# Patient Record
Sex: Male | Born: 1965 | Race: White | Hispanic: No | Marital: Married | Smoking: Current some day smoker
Health system: Southern US, Community
[De-identification: ages and names within clinical notes are randomized; demographics above are authoritative.]

## PROBLEM LIST (undated history)

## (undated) DIAGNOSIS — K5909 Other constipation: Secondary | ICD-10-CM

## (undated) DIAGNOSIS — G629 Polyneuropathy, unspecified: Secondary | ICD-10-CM

## (undated) DIAGNOSIS — M25522 Pain in left elbow: Secondary | ICD-10-CM

## (undated) DIAGNOSIS — I712 Thoracic aortic aneurysm, without rupture, unspecified: Secondary | ICD-10-CM

## (undated) DIAGNOSIS — E7800 Pure hypercholesterolemia, unspecified: Secondary | ICD-10-CM

## (undated) DIAGNOSIS — R7989 Other specified abnormal findings of blood chemistry: Secondary | ICD-10-CM

## (undated) DIAGNOSIS — R55 Syncope and collapse: Secondary | ICD-10-CM

## (undated) DIAGNOSIS — Q2381 Bicuspid aortic valve: Secondary | ICD-10-CM

## (undated) DIAGNOSIS — G4730 Sleep apnea, unspecified: Secondary | ICD-10-CM

## (undated) DIAGNOSIS — M67929 Unspecified disorder of synovium and tendon, unspecified upper arm: Secondary | ICD-10-CM

## (undated) DIAGNOSIS — Z950 Presence of cardiac pacemaker: Secondary | ICD-10-CM

## (undated) DIAGNOSIS — I4891 Unspecified atrial fibrillation: Secondary | ICD-10-CM

## (undated) DIAGNOSIS — K760 Fatty (change of) liver, not elsewhere classified: Secondary | ICD-10-CM

## (undated) DIAGNOSIS — R42 Dizziness and giddiness: Secondary | ICD-10-CM

## (undated) DIAGNOSIS — E785 Hyperlipidemia, unspecified: Secondary | ICD-10-CM

## (undated) DIAGNOSIS — R0789 Other chest pain: Secondary | ICD-10-CM

## (undated) DIAGNOSIS — Q231 Congenital insufficiency of aortic valve: Secondary | ICD-10-CM

## (undated) DIAGNOSIS — I10 Essential (primary) hypertension: Secondary | ICD-10-CM

## (undated) HISTORY — DX: Congenital insufficiency of aortic valve: Q23.1

## (undated) HISTORY — DX: Presence of cardiac pacemaker: Z95.0

## (undated) HISTORY — DX: Other chest pain: R07.89

## (undated) HISTORY — PX: KNEE ARTHROSCOPY: SUR90

## (undated) HISTORY — DX: Hyperlipidemia, unspecified: E78.5

## (undated) HISTORY — DX: Unspecified atrial fibrillation: I48.91

## (undated) HISTORY — DX: Thoracic aortic aneurysm, without rupture: I71.2

## (undated) HISTORY — DX: Dizziness and giddiness: R42

## (undated) HISTORY — PX: PACEMAKER INSERTION: SHX728

## (undated) HISTORY — DX: Essential (primary) hypertension: I10

## (undated) HISTORY — DX: Other specified abnormal findings of blood chemistry: R79.89

## (undated) HISTORY — DX: Pain in left elbow: M25.522

## (undated) HISTORY — DX: Thoracic aortic aneurysm, without rupture, unspecified: I71.20

## (undated) HISTORY — DX: Fatty (change of) liver, not elsewhere classified: K76.0

## (undated) HISTORY — PX: COLONOSCOPY: SHX174

## (undated) HISTORY — DX: Polyneuropathy, unspecified: G62.9

## (undated) HISTORY — DX: Bicuspid aortic valve: Q23.81

## (undated) HISTORY — DX: Syncope and collapse: R55

## (undated) HISTORY — DX: Unspecified disorder of synovium and tendon, unspecified upper arm: M67.929

---

## 2014-05-17 ENCOUNTER — Encounter (HOSPITAL_COMMUNITY): Payer: Self-pay

## 2014-05-17 ENCOUNTER — Emergency Department (HOSPITAL_COMMUNITY): Payer: BLUE CROSS/BLUE SHIELD

## 2014-05-17 ENCOUNTER — Emergency Department (HOSPITAL_COMMUNITY)
Admission: EM | Admit: 2014-05-17 | Discharge: 2014-05-17 | Disposition: A | Discharge status: Home / Self Care | Payer: BLUE CROSS/BLUE SHIELD | Attending: Emergency Medicine | Admitting: Emergency Medicine

## 2014-05-17 DIAGNOSIS — R001 Bradycardia, unspecified: Secondary | ICD-10-CM | POA: Diagnosis not present

## 2014-05-17 DIAGNOSIS — R55 Syncope and collapse: Secondary | ICD-10-CM

## 2014-05-17 DIAGNOSIS — E780 Pure hypercholesterolemia: Secondary | ICD-10-CM | POA: Insufficient documentation

## 2014-05-17 DIAGNOSIS — Z79899 Other long term (current) drug therapy: Secondary | ICD-10-CM | POA: Insufficient documentation

## 2014-05-17 LAB — BASIC METABOLIC PANEL
Anion gap: 4 — ABNORMAL LOW (ref 5–15)
BUN: 10 mg/dL (ref 6–23)
CALCIUM: 8.4 mg/dL (ref 8.4–10.5)
CO2: 29 mmol/L (ref 19–32)
CREATININE: 0.97 mg/dL (ref 0.50–1.35)
Chloride: 108 mmol/L (ref 96–112)
GFR calc Af Amer: >90 mL/min (ref >90)
GFR calc non Af Amer: >90 mL/min (ref >90)
Glucose, Bld: 103 mg/dL — ABNORMAL HIGH (ref 70–99)
POTASSIUM: 4.1 mmol/L (ref 3.5–5.1)
Sodium: 141 mmol/L (ref 135–145)

## 2014-05-17 LAB — CBC WITH DIFFERENTIAL/PLATELET
BASOS ABS: 0 10*3/uL (ref 0.0–0.1)
Basophils Relative: 0 % (ref 0–1)
Eosinophils Absolute: 0.2 10*3/uL (ref 0.0–0.7)
Eosinophils Relative: 2 % (ref 0–5)
HCT: 40 % (ref 39.0–52.0)
Hemoglobin: 13.6 g/dL (ref 13.0–17.0)
LYMPHS ABS: 1.6 10*3/uL (ref 0.7–4.0)
Lymphocytes Relative: 18 % (ref 12–46)
MCH: 30 pg (ref 26.0–34.0)
MCHC: 34 g/dL (ref 30.0–36.0)
MCV: 88.3 fL (ref 78.0–100.0)
MONO ABS: 0.8 10*3/uL (ref 0.1–1.0)
Monocytes Relative: 9 % (ref 3–12)
NEUTROS PCT: 71 % (ref 43–77)
Neutro Abs: 6.3 10*3/uL (ref 1.7–7.7)
PLATELETS: 244 10*3/uL (ref 150–400)
RBC: 4.53 MIL/uL (ref 4.22–5.81)
RDW: 12.2 % (ref 11.5–15.5)
WBC: 8.9 10*3/uL (ref 4.0–10.5)

## 2014-05-17 LAB — I-STAT TROPONIN, ED: TROPONIN I, POC: 0 ng/mL (ref 0.00–0.08)

## 2014-05-17 MED ORDER — SODIUM CHLORIDE 0.9 % IV BOLUS (SEPSIS)
1000.0000 mL | Freq: Once | INTRAVENOUS | Status: AC
  Administered 2014-05-17: 1000 mL via INTRAVENOUS

## 2014-05-17 NOTE — ED Provider Notes (Signed)
CSN: [id number]     Arrival date & time 05/17/14  1308 History   First MD Initiated Contact with Patient 05/17/14 1309     Chief Complaint  Patient presents with  . Loss of Consciousness     (Consider location/radiation/quality/duration/timing/severity/associated sxs/prior Treatment) Patient is a 49 y.o. male presenting with near-syncope.  Near Syncope This is a new problem. The current episode started less than 1 hour ago. The problem occurs constantly. The problem has been resolved. Pertinent negatives include no chest pain, no abdominal pain, no headaches and no shortness of breath. Nothing aggravates the symptoms. Nothing relieves the symptoms. He has tried nothing for the symptoms.    Past Medical History  Diagnosis Date  . Hypercholesteremia    Past Surgical History  Procedure Laterality Date  . Knee arthroscopy     History reviewed. No pertinent family history. History  Substance Use Topics  . Smoking status: Never Smoker   . Smokeless tobacco: Not on file  . Alcohol Use: Yes     Comment: weekend use    Review of Systems  Respiratory: Negative for shortness of breath.   Cardiovascular: Positive for near-syncope. Negative for chest pain.  Gastrointestinal: Negative for abdominal pain.  Neurological: Negative for headaches.  All other systems reviewed and are negative.     Allergies  Lipitor and Simvastatin  Home Medications   Prior to Admission medications   Medication Sig Start Date End Date Taking? Authorizing Provider  Ascorbic Acid (VITAMIN C) 1000 MG tablet Take 1,000 mg by mouth daily.   Yes Historical Provider, MD  Multiple Vitamins-Minerals (AIRBORNE PO) Take 1 tablet by mouth daily.   Yes Historical Provider, MD  Multiple Vitamins-Minerals (MULTIVITAMIN WITH MINERALS) tablet Take 1 tablet by mouth daily.   Yes Historical Provider, MD  rosuvastatin (CRESTOR) 10 MG tablet Take 10 mg by mouth every other day.   Yes Historical Provider, MD   BP 107/72  mmHg  Pulse 52  Temp(Src) 97.9 F (36.6 C) (Oral)  Resp 15  Ht  (1.854 m)  Wt 195 lb (88.451 kg)  BMI 25.73 kg/m2  SpO2 100% Physical Exam  Constitutional: He is oriented to person, place, and time. He appears well-developed and well-nourished.  HENT:  Head: Normocephalic and atraumatic.  Eyes: Conjunctivae and EOM are normal.  Neck: Normal range of motion. Neck supple.  Cardiovascular: Normal rate, regular rhythm and normal heart sounds.   Pulmonary/Chest: Effort normal and breath sounds normal. No respiratory distress.  Abdominal: He exhibits no distension. There is no tenderness. There is no rebound and no guarding.  Musculoskeletal: Normal range of motion.  Neurological: He is alert and oriented to person, place, and time. He has normal strength and normal reflexes. No cranial nerve deficit or sensory deficit. Coordination normal. GCS eye subscore is 4. GCS verbal subscore is 5. GCS motor subscore is 6.  Skin: Skin is warm and dry.  Vitals reviewed.   ED Course  Procedures (including critical care time) Labs Review Labs Reviewed  BASIC METABOLIC PANEL - Abnormal; Notable for the following:    Glucose, Bld 103 (*)    Anion gap 4 (*)    All other components within normal limits  CBC WITH DIFFERENTIAL/PLATELET  I-STAT TROPOININ, ED    Imaging Review Ct Head Wo Contrast  05/18/2014   CLINICAL DATA:  Near syncopal episode today. Generalized weakness. No loss of consciousness. Symptoms lasted 20 min before resolving. Similar episode yesterday. Mild chest pain. Nausea yesterday.  EXAM: CT  HEAD WITHOUT CONTRAST  TECHNIQUE: Contiguous axial images were obtained from the base of the skull through the vertex without intravenous contrast.  COMPARISON:  None.  FINDINGS: Ventricles and sulci appear symmetrical. No ventricular dilatation. No mass effect or midline shift. No abnormal extra-axial fluid collections. Gray-white matter junctions are distinct. Basal cisterns are not effaced.  No evidence of acute intracranial hemorrhage. No depressed skull fractures. Mild mucosal thickening in the paranasal sinuses. Mastoid air cells are not opacified.  IMPRESSION: No acute intracranial abnormalities. Mild mucosal thickening in the paranasal sinuses.   Electronically Signed   By: Burman NievesWilliam  Stevens M.D.   On: 05/18/2014 17:57   Ct Angio Chest Aorta W/cm &/or Wo/cm  05/19/2014   CLINICAL DATA:  Two syncopal episodes, chest pain, no shortness of breath  EXAM: CT ANGIOGRAPHY CHEST WITH CONTRAST  TECHNIQUE: Multidetector CT imaging of the chest was performed using the standard protocol during bolus administration of intravenous contrast. Multiplanar CT image reconstructions and MIPs were obtained to evaluate the vascular anatomy.  CONTRAST:  100mL OMNIPAQUE IOHEXOL 350 MG/ML SOLN  COMPARISON:  None.  FINDINGS: There is an ascending thoracic aortic aneurysm measuring 4.8 cm in diameter. The remainder of the thoracic aorta is normal in caliber. There is no thoracic aortic dissection. There is no para-aortic hematoma. The heart size is normal. There is no pericardial effusion.  The lungs are clear. There is no focal consolidation, pleural effusion or pneumothorax.  There is no axillary, hilar, or mediastinal adenopathy.  There is no lytic or blastic osseous lesion.  There is a 12 mm hyperenhancing right hepatic lesion likely reflecting a flash filling hemangioma or arterioportal malformation. There is a similar lesion in the left hepatic lobe measuring 8 mm.  Review of the MIP images confirms the above findings.  IMPRESSION: 1. No thoracic aortic dissection. 2. Ascending thoracic aortic aneurysm. Ascending thoracic aortic aneurysm. Recommend semi-annual imaging followup by CTA or MRA and referral to cardiothoracic surgery if not already obtained. This recommendation follows 2010 ACCF/AHA/AATS/ACR/ASA/SCA/SCAI/SIR/STS/SVM Guidelines for the Diagnosis and Management of Patients With Thoracic Aortic Disease.  Circulation. 2010; 121: [id number] 3. There is a 12 mm hyperenhancing right hepatic lesion and a similar smaller lobe 8 mm left hepatic lesion likely reflecting a flash filling hemangioma or arterioportal malformation. If there is further clinical concern, further evaluation with a non emergent MRI of the abdomen can be performed.   Electronically Signed   By: Elige KoHetal  Patel   On: 05/19/2014 16:04     EKG Interpretation   Date/Time:  Sunday May 17 2014 13:18:31 EST Ventricular Rate:  48 PR Interval:  206 QRS Duration: 113 QT Interval:  436 QTC Calculation: 389 R Axis:   -12 Text Interpretation:  Sinus bradycardia Borderline intraventricular  conduction delay ST elev, probable normal early repol pattern No old  tracing to compare Confirmed by Mirian MoGentry, Aoi Kouns [phone]) on 05/17/2014  2:23:47 PM      MDM   Final diagnoses:  Near syncope    49 y.o. male with pertinent PMH of hld presents with near syncopal episode while at church.  Pt was kneeling prior to onset of symptoms, no chest pain or dyspnea. No family ho sudden death or cardiogenic syncope. No neuro complaint.  EMS found pt to have BP 70 systolic.  400 cc fluids given.  Also bradycardic.  Physical exam on arrival as above.    Symptoms relieved by NS bolus.  Discussed admission with pt, with shared decision making agreed to dc home with  cardiology fu (they have arranged for refferal to Dr. Gala Romney).  DC home in stable condition.    I have reviewed all laboratory and imaging studies if ordered as above  1. Near syncope         Noel Gerold, MD 05/20/14 [phone]

## 2014-05-17 NOTE — ED Notes (Signed)
[facility]- pt coming from church after becoming weak, dizzy, and diaphoretic. Hypotensive on EMS arrival at 70/palp, given 400cc of fluid and placed in trendelenburg with pressure up to 116 palpated. CBG of 92. Bradycardic in the 40-50range with history of being very active.

## 2014-05-17 NOTE — Discharge Instructions (Signed)
Cardiac Event Monitoring °A cardiac event monitor is a small recording device used to help detect abnormal heart rhythms (arrhythmias). The monitor is used to record heart rhythm when noticeable symptoms such as the following occur: °· Fast heartbeats (palpitations), such as heart racing or fluttering. °· Dizziness. °· Fainting or light-headedness. °· Unexplained weakness. °The monitor is wired to two electrodes placed on your chest. Electrodes are flat, sticky disks that attach to your skin. The monitor can be worn for up to 30 days. You will wear the monitor at all times, except when bathing.  °HOW TO USE YOUR CARDIAC EVENT MONITOR °A technician will prepare your chest for the electrode placement. The technician will show you how to place the electrodes, how to work the monitor, and how to replace the batteries. Take time to practice using the monitor before you leave the office. Make sure you understand how to send the information from the monitor to your health care provider. This requires a telephone with a landline, not a cell phone. You need to: °· Wear your monitor at all times, except when you are in water: °· Do not get the monitor wet. °· Take the monitor off when bathing. Do not swim or use a hot tub with it on. °· Keep your skin clean. Do not put body lotion or moisturizer on your chest. °· Change the electrodes daily or any time they stop sticking to your skin. You might need to use tape to keep them on. °· It is possible that your skin under the electrodes could become irritated. To keep this from happening, try to put the electrodes in slightly different places on your chest. However, they must remain in the area under your left breast and in the upper right section of your chest. °· Make sure the monitor is safely clipped to your clothing or in a location close to your body that your health care provider recommends. °· Press the button to record when you feel symptoms of heart trouble, such as  dizziness, weakness, light-headedness, palpitations, thumping, shortness of breath, unexplained weakness, or a fluttering or racing heart. The monitor is always on and records what happened slightly before you pressed the button, so do not worry about being too late to get good information. °· Keep a diary of your activities, such as walking, doing chores, and taking medicine. It is especially important to note what you were doing when you pushed the button to record your symptoms. This will help your health care provider determine what might be contributing to your symptoms. The information stored in your monitor will be reviewed by your health care provider alongside your diary entries. °· Send the recorded information as recommended by your health care provider. It is important to understand that it will take some time for your health care provider to process the results. °· Change the batteries as recommended by your health care provider. °SEEK IMMEDIATE MEDICAL CARE IF:  °· You have chest pain. °· You have extreme difficulty breathing or shortness of breath. °· You develop a very fast heartbeat that persists. °· You develop dizziness that does not go away. °· You faint or constantly feel you are about to faint. °Document Released: 12/07/2007 Document Revised: 07/14/2013 Document Reviewed: 08/26/2012 °ExitCare® Patient Information ©2015 [employer]. This information is not intended to replace advice given to you by your health care provider. Make sure you discuss any questions you have with your health care provider. ° °Near-Syncope °Near-syncope (commonly known as   near fainting) is sudden weakness, dizziness, or feeling like you might pass out. During an episode of near-syncope, you may also develop pale skin, have tunnel vision, or feel sick to your stomach (nauseous). Near-syncope may occur when getting up after sitting or while standing for a long time. It is caused by a sudden decrease in blood flow to  the brain. This decrease can result from various causes or triggers, most of which are not serious. However, because near-syncope can sometimes be a sign of something serious, a medical evaluation is required. The specific cause is often not determined. °HOME CARE INSTRUCTIONS  °Monitor your condition for any changes. The following actions may help to alleviate any discomfort you are experiencing: °· Have someone stay with you until you feel stable. °· Lie down right away and prop your feet up if you start feeling like you might faint. Breathe deeply and steadily. Wait until all the symptoms have passed. Most of these episodes last only a few minutes. You may feel tired for several hours.   °· Drink enough fluids to keep your urine clear or pale yellow.   °· If you are taking blood pressure or heart medicine, get up slowly when seated or lying down. Take several minutes to sit and then stand. This can reduce dizziness. °· Follow up with your health care provider as directed.  °SEEK IMMEDIATE MEDICAL CARE IF:  °· You have a severe headache.   °· You have unusual pain in the chest, abdomen, or back.   °· You are bleeding from the mouth or rectum, or you have black or tarry stool.   °· You have an irregular or very fast heartbeat.   °· You have repeated fainting or have seizure-like jerking during an episode.   °· You faint when sitting or lying down.   °· You have confusion.   °· You have difficulty walking.   °· You have severe weakness.   °· You have vision problems.   °MAKE SURE YOU:  °· Understand these instructions. °· Will watch your condition. °· Will get help right away if you are not doing well or get worse. °Document Released: 02/27/2005 Document Revised: 03/04/2013 Document Reviewed: 08/02/2012 °ExitCare® Patient Information ©2015 [employer]. This information is not intended to replace advice given to you by your health care provider. Make sure you discuss any questions you have with your health care  provider. ° °

## 2014-05-17 NOTE — ED Notes (Signed)
Patient transported to X-ray 

## 2014-05-18 ENCOUNTER — Encounter (HOSPITAL_COMMUNITY): Payer: Self-pay | Admitting: Emergency Medicine

## 2014-05-18 ENCOUNTER — Observation Stay (HOSPITAL_COMMUNITY)
Admission: EM | Admit: 2014-05-18 | Discharge: 2014-05-19 | Disposition: A | Discharge status: Home / Self Care | Payer: BLUE CROSS/BLUE SHIELD | Attending: Internal Medicine | Admitting: Internal Medicine

## 2014-05-18 ENCOUNTER — Emergency Department (HOSPITAL_COMMUNITY): Payer: BLUE CROSS/BLUE SHIELD

## 2014-05-18 ENCOUNTER — Observation Stay (HOSPITAL_COMMUNITY): Payer: BLUE CROSS/BLUE SHIELD

## 2014-05-18 DIAGNOSIS — R55 Syncope and collapse: Secondary | ICD-10-CM

## 2014-05-18 DIAGNOSIS — I77819 Aortic ectasia, unspecified site: Secondary | ICD-10-CM

## 2014-05-18 DIAGNOSIS — I712 Thoracic aortic aneurysm, without rupture: Secondary | ICD-10-CM | POA: Diagnosis present

## 2014-05-18 DIAGNOSIS — Z79899 Other long term (current) drug therapy: Secondary | ICD-10-CM | POA: Insufficient documentation

## 2014-05-18 DIAGNOSIS — E780 Pure hypercholesterolemia: Secondary | ICD-10-CM | POA: Diagnosis not present

## 2014-05-18 DIAGNOSIS — I7121 Aneurysm of the ascending aorta, without rupture: Secondary | ICD-10-CM | POA: Diagnosis present

## 2014-05-18 HISTORY — DX: Syncope and collapse: R55

## 2014-05-18 HISTORY — DX: Pure hypercholesterolemia, unspecified: E78.00

## 2014-05-18 LAB — I-STAT TROPONIN, ED: Troponin i, poc: 0 ng/mL (ref 0.00–0.08)

## 2014-05-18 LAB — BASIC METABOLIC PANEL
Anion gap: 5 (ref 5–15)
BUN: 8 mg/dL (ref 6–23)
CHLORIDE: 108 mmol/L (ref 96–112)
CO2: 29 mmol/L (ref 19–32)
CREATININE: 1.02 mg/dL (ref 0.50–1.35)
Calcium: 8.9 mg/dL (ref 8.4–10.5)
GFR calc Af Amer: >90 mL/min (ref >90)
GFR calc non Af Amer: 85 mL/min — ABNORMAL LOW (ref >90)
GLUCOSE: 92 mg/dL (ref 70–99)
POTASSIUM: 4.2 mmol/L (ref 3.5–5.1)
Sodium: 142 mmol/L (ref 135–145)

## 2014-05-18 LAB — CBC WITH DIFFERENTIAL/PLATELET
BASOS PCT: 0 % (ref 0–1)
Basophils Absolute: 0 10*3/uL (ref 0.0–0.1)
EOS ABS: 0.2 10*3/uL (ref 0.0–0.7)
Eosinophils Relative: 3 % (ref 0–5)
HCT: 42.5 % (ref 39.0–52.0)
HEMOGLOBIN: 14.2 g/dL (ref 13.0–17.0)
Lymphocytes Relative: 27 % (ref 12–46)
Lymphs Abs: 1.9 10*3/uL (ref 0.7–4.0)
MCH: 29.8 pg (ref 26.0–34.0)
MCHC: 33.4 g/dL (ref 30.0–36.0)
MCV: 89.1 fL (ref 78.0–100.0)
MONOS PCT: 8 % (ref 3–12)
Monocytes Absolute: 0.5 10*3/uL (ref 0.1–1.0)
NEUTROS PCT: 62 % (ref 43–77)
Neutro Abs: 4.4 10*3/uL (ref 1.7–7.7)
PLATELETS: 276 10*3/uL (ref 150–400)
RBC: 4.77 MIL/uL (ref 4.22–5.81)
RDW: 12.1 % (ref 11.5–15.5)
WBC: 7 10*3/uL (ref 4.0–10.5)

## 2014-05-18 LAB — HEPATIC FUNCTION PANEL
ALK PHOS: 62 U/L (ref 39–117)
ALT: 36 U/L (ref 0–53)
AST: 22 U/L (ref 0–37)
Albumin: 4.1 g/dL (ref 3.5–5.2)
Bilirubin, Direct: 0.1 mg/dL (ref 0.0–0.5)
Indirect Bilirubin: 0.6 mg/dL (ref 0.3–0.9)
Total Bilirubin: 0.7 mg/dL (ref 0.3–1.2)
Total Protein: 7.2 g/dL (ref 6.0–8.3)

## 2014-05-18 LAB — MAGNESIUM: Magnesium: 2.2 mg/dL (ref 1.5–2.5)

## 2014-05-18 LAB — TROPONIN I: Troponin I: <0.03 ng/mL (ref <0.031)

## 2014-05-18 LAB — PHOSPHORUS: Phosphorus: 3.2 mg/dL (ref 2.3–4.6)

## 2014-05-18 LAB — TSH: TSH: 1.345 u[IU]/mL (ref 0.350–4.500)

## 2014-05-18 MED ORDER — SODIUM CHLORIDE 0.9 % IV SOLN: 250.0000 mL | INTRAVENOUS | Status: DC | PRN

## 2014-05-18 MED ORDER — HEPARIN SODIUM (PORCINE) 5000 UNIT/ML IJ SOLN
5000.0000 [IU] | Freq: Three times a day (TID) | INTRAMUSCULAR | Status: DC
  Administered 2014-05-18: 5000 [IU] via SUBCUTANEOUS
  Filled 2014-05-18 (×2): qty 1

## 2014-05-18 MED ORDER — SODIUM CHLORIDE 0.9 % IJ SOLN: 3.0000 mL | Freq: Two times a day (BID) | INTRAMUSCULAR | Status: DC

## 2014-05-18 MED ORDER — ACETAMINOPHEN 325 MG PO TABS
650.0000 mg | ORAL_TABLET | Freq: Four times a day (QID) | ORAL | Status: DC | PRN
  Administered 2014-05-18: 650 mg via ORAL
  Filled 2014-05-18: qty 2

## 2014-05-18 MED ORDER — ACETAMINOPHEN 650 MG RE SUPP: 650.0000 mg | Freq: Four times a day (QID) | RECTAL | Status: DC | PRN

## 2014-05-18 MED ORDER — SODIUM CHLORIDE 0.9 % IJ SOLN
3.0000 mL | Freq: Two times a day (BID) | INTRAMUSCULAR | Status: DC
  Administered 2014-05-18 – 2014-05-19 (×2): 3 mL via INTRAVENOUS

## 2014-05-18 MED ORDER — ROSUVASTATIN CALCIUM 10 MG PO TABS: 10.0000 mg | ORAL_TABLET | ORAL | Status: DC

## 2014-05-18 MED ORDER — SODIUM CHLORIDE 0.9 % IJ SOLN: 3.0000 mL | INTRAMUSCULAR | Status: DC | PRN

## 2014-05-18 NOTE — H&P (Signed)
[facility] History and Physical  Jason Osborn ZOX:[id number] DOB: 15-Nov-1965 DOA: 05/18/2014  Referring physician: Dr. Preston Fleeting PCP: Vivien Presto, MD   Chief Complaint: Syncope   HPI: Jason Osborn is a 49 y.o. male  Reports patient being previously healthy. Presents to the hospital after 2 syncopal episodes. Patient evaluated yesterday 05/17/2014 at that point he had presented after syncopal episode while at church. He states he knelt down and passed out. Today he had near syncope and as such presented for further evaluation recommendations. He states that he has had an upper respiratory infection for the last month. He denies no decreased oral intake although he does state that he is more thirsty than usual within the last couple days. Denies any palpitations prior to passing out. Spouse present when patient passed out yesterday and reports no epileptic activity. The patient was out for minutes if not seconds when he came back to. Patient also reports dull chest discomfort that he reports felt like reflux.  Given syncope and near syncopal episodes were consulted for further evaluation recommendations   Review of Systems:  Constitutional:  No weight loss, night sweats, Fevers, chills, fatigue.  HEENT:  No headaches, Difficulty swallowing,Tooth/dental problems,Sore throat,  No sneezing, itching, ear ache, nasal congestion, post nasal drip,  Cardio-vascular:  + chest pain, Orthopnea, PND, swelling in lower extremities, anasarca, dizziness, palpitations  GI:  + heartburn, indigestion, abdominal pain, nausea, vomiting, diarrhea, change in bowel habits, loss of appetite  Resp:  No shortness of breath with exertion or at rest. No excess mucus, no productive cough, No non-productive cough, No coughing up of blood.No change in color of mucus.No wheezing.No chest wall deformity  Skin:  no rash or lesions.  GU:  no dysuria, change in color of urine, no urgency or frequency. No flank pain.    Musculoskeletal:  No joint pain or swelling. No decreased range of motion. No back pain.  Psych:  No change in mood or affect. No depression or anxiety. No memory loss.   Past Medical History  Diagnosis Date  . Hypercholesteremia    History reviewed. No pertinent past surgical history. Social History:  reports that he has never smoked. He does not have any smokeless tobacco history on file. He reports that he drinks alcohol. He reports that he does not use illicit drugs.  Allergies  Allergen Reactions  . Lipitor [Atorvastatin]     Frequent urination  . Simvastatin     Body aches     Family history - None reported when asked directly  Prior to Admission medications   Medication Sig Start Date End Date Taking? Authorizing Provider  Ascorbic Acid (VITAMIN C) 1000 MG tablet Take 1,000 mg by mouth daily.   Yes Historical Provider, MD  Multiple Vitamins-Minerals (AIRBORNE PO) Take 1 tablet by mouth daily.   Yes Historical Provider, MD  Multiple Vitamins-Minerals (MULTIVITAMIN WITH MINERALS) tablet Take 1 tablet by mouth daily.   Yes Historical Provider, MD  rosuvastatin (CRESTOR) 10 MG tablet Take 10 mg by mouth every other day.   Yes Historical Provider, MD   Physical Exam: Filed Vitals:   05/18/14 1630 05/18/14 1645 05/18/14 1700 05/18/14 1715  BP: 109/70 112/69 111/74 112/78  Pulse: 61 62 62 69  Temp:      TempSrc:      Resp: SpO2: 97% 98% 98% 98%    Wt Readings from Last 3 Encounters:  05/17/14 88.451 kg (195 lb)    General:  Appears  calm and comfortable Eyes: PERRL, normal lids, irises & conjunctiva ENT: grossly normal hearing, lips & tongue Neck: no LAD, masses or thyromegaly Cardiovascular: RRR, no m/r/g. No LE edema. Respiratory: CTA bilaterally, no w/r/r. Normal respiratory effort. Abdomen: soft, nt, nd Skin: no rash or induration seen on limited exam Musculoskeletal: grossly normal tone BUE/BLE Psychiatric: grossly normal mood and affect,  speech fluent and appropriate Neurologic: grossly non-focal.          Labs on Admission:  Basic Metabolic Panel:  Recent Labs Lab 05/17/14 1331 05/18/14 1448  NA 141 142  K 4.1 4.2  CL 108 108  CO2 29 29  GLUCOSE 103* 92  BUN 10 8  CREATININE 0.97 1.02  CALCIUM 8.4 8.9   Liver Function Tests: No results for input(s): AST, ALT, ALKPHOS, BILITOT, PROT, ALBUMIN in the last 168 hours. No results for input(s): LIPASE, AMYLASE in the last 168 hours. No results for input(s): AMMONIA in the last 168 hours. CBC:  Recent Labs Lab 05/17/14 1331 05/18/14 1448  WBC 8.9 7.0  NEUTROABS 6.3 4.4  HGB 13.6 14.2  HCT 40.0 42.5  MCV 88.3 89.1  PLT 244 276   Cardiac Enzymes: No results for input(s): CKTOTAL, CKMB, CKMBINDEX, TROPONINI in the last 168 hours.  BNP (last 3 results) No results for input(s): BNP in the last 8760 hours.  ProBNP (last 3 results) No results for input(s): PROBNP in the last 8760 hours.  CBG: No results for input(s): GLUCAP in the last 168 hours.  Radiological Exams on Admission: Dg Chest 2 View  05/18/2014   CLINICAL DATA:  Dizziness and lightheadedness. Syncope. Chest tightness.  EXAM: CHEST  2 VIEW  COMPARISON:  05/17/2014  FINDINGS: The heart size and mediastinal contours are within normal limits. Both lungs are clear. The visualized skeletal structures are unremarkable.  IMPRESSION: No active cardiopulmonary disease.   Electronically Signed   By: Gaylyn RongWalter  Liebkemann M.D.   On: 05/18/2014 15:43   Dg Chest 2 View  05/17/2014   CLINICAL DATA:  Loss of consciousness.  EXAM: CHEST  2 VIEW  COMPARISON:  None.  FINDINGS: The heart size and mediastinal contours are within normal limits. Both lungs are clear. The visualized skeletal structures are unremarkable.  IMPRESSION: No active cardiopulmonary disease.   Electronically Signed   By: Myles RosenthalJohn  Stahl M.D.   On: 05/17/2014 16:10   Ct Head Wo Contrast  05/18/2014   CLINICAL DATA:  Near syncopal episode today.  Generalized weakness. No loss of consciousness. Symptoms lasted 20 min before resolving. Similar episode yesterday. Mild chest pain. Nausea yesterday.  EXAM: CT HEAD WITHOUT CONTRAST  TECHNIQUE: Contiguous axial images were obtained from the base of the skull through the vertex without intravenous contrast.  COMPARISON:  None.  FINDINGS: Ventricles and sulci appear symmetrical. No ventricular dilatation. No mass effect or midline shift. No abnormal extra-axial fluid collections. Gray-white matter junctions are distinct. Basal cisterns are not effaced. No evidence of acute intracranial hemorrhage. No depressed skull fractures. Mild mucosal thickening in the paranasal sinuses. Mastoid air cells are not opacified.  IMPRESSION: No acute intracranial abnormalities. Mild mucosal thickening in the paranasal sinuses.   Electronically Signed   By: Burman NievesWilliam  Stevens M.D.   On: 05/18/2014 17:57    EKG: Independently reviewed. Sinus bradycardia with no ST elevations or depressions  Assessment/Plan Active Problems:   Near syncope/Syncope - We'll obtain routine workup. Obtain echocardiogram, carotid duplex, telemetry monitoring, EKG next a.m., troponins every 6 hours 3, and orthostatic vital signs. -  We'll not obtain d-dimer as patient denies any tachycardia, shortness of breath, hemoptysis, or any prolonged trips - We'll not obtain EEG as there was no reports of epileptic activity - Patient has had bradycardia but has not had hypotension. We'll monitor on telemetry, should patient have any altered mental status and/or decrease in blood pressure associated with bradycardia will consult cardiology for further evaluation recommendations  Code Status: full DVT Prophylaxis: heparin Family Communication: discussed with patient and spouse at bedside.  Disposition Plan: Pending work up  Time spent: > 45 minutes  Penny Pia [facility] Pager [phone]

## 2014-05-18 NOTE — ED Provider Notes (Signed)
CSN: [id number]     Arrival date & time 05/18/14  1433 History   First MD Initiated Contact with Patient 05/18/14 1659     Chief Complaint  Patient presents with  . Near Syncope     (Consider location/radiation/quality/duration/timing/severity/associated sxs/prior Treatment) Patient is a 49 y.o. male presenting with near-syncope. The history is provided by the patient and the spouse.  Near Syncope  He had a near syncopal episode today. He was in the bathroom when he started feeling lightheaded then he laid down on the bed. He was sweaty and wife states that he was pale. He is feeling generally weak. There was no actual loss of consciousness. Symptoms lasted about 20 minutes before resolving. He had a similar episode yesterday which lasted about 10 minutes. The episode yesterday occurred while kneeling and urge. He had been seen in the ED and was offered admission but decided to go home. He was unable to get a follow-up appointment with cardiology. He describes a very mild, dull chest pain which he rates at 1/10. He had nausea yesterday, but not today. There is no dyspnea. He has a history of hyperlipidemia for which he takes rosuvastatin, but is otherwise healthy.  Past Medical History  Diagnosis Date  . Hypercholesteremia    History reviewed. No pertinent past surgical history. History reviewed. No pertinent family history. History  Substance Use Topics  . Smoking status: Never Smoker   . Smokeless tobacco: Not on file  . Alcohol Use: Yes     Comment: weekend use    Review of Systems  Cardiovascular: Positive for near-syncope.  All other systems reviewed and are negative.     Allergies  Lipitor and Simvastatin  Home Medications   Prior to Admission medications   Medication Sig Start Date End Date Taking? Authorizing Provider  Ascorbic Acid (VITAMIN C) 1000 MG tablet Take 1,000 mg by mouth daily.   Yes Historical Provider, MD  Multiple Vitamins-Minerals (AIRBORNE PO) Take 1  tablet by mouth daily.   Yes Historical Provider, MD  Multiple Vitamins-Minerals (MULTIVITAMIN WITH MINERALS) tablet Take 1 tablet by mouth daily.   Yes Historical Provider, MD  rosuvastatin (CRESTOR) 10 MG tablet Take 10 mg by mouth every other day.   Yes Historical Provider, MD   BP 114/69 mmHg  Pulse 57  Temp(Src) 98.1 F (36.7 C) (Oral)  Resp 18  SpO2 100% Physical Exam  Nursing note and vitals reviewed.  49 year old male, resting comfortably and in no acute distress. Vital signs are significant for mild bradycardia. Oxygen saturation is 100%, which is normal. Head is normocephalic and atraumatic. PERRLA, EOMI. Oropharynx is clear. Neck is nontender and supple without adenopathy or JVD. There are no carotid bruits. Back is nontender and there is no CVA tenderness. Lungs are clear without rales, wheezes, or rhonchi. Chest is nontender. Heart has regular rate and rhythm without murmur. Abdomen is soft, flat, nontender without masses or hepatosplenomegaly and peristalsis is normoactive. Extremities have no cyanosis or edema, full range of motion is present. Skin is warm and dry without rash. Neurologic: Mental status is normal, cranial nerves are intact, there are no motor or sensory deficits.  ED Course  Procedures (including critical care time) Labs Review Results for orders placed or performed during the hospital encounter of 05/18/14  Basic metabolic panel  Result Value Ref Range   Sodium 142 135 - 145 mmol/L   Potassium 4.2 3.5 - 5.1 mmol/L   Chloride 108 96 - 112 mmol/L  CO2 29 19 - 32 mmol/L   Glucose, Bld 92 70 - 99 mg/dL   BUN 8 6 - 23 mg/dL   Creatinine, Ser 8.29 0.50 - 1.35 mg/dL   Calcium 8.9 8.4 - 56.2 mg/dL   GFR calc non Af Amer 85 (L) >90 mL/min   GFR calc Af Amer >90 >90 mL/min   Anion gap 5 5 - 15  CBC with Differential  Result Value Ref Range   WBC 7.0 4.0 - 10.5 K/uL   RBC 4.77 4.22 - 5.81 MIL/uL   Hemoglobin 14.2 13.0 - 17.0 g/dL   HCT 13.0 86.5 -  78.4 %   MCV 89.1 78.0 - 100.0 fL   MCH 29.8 26.0 - 34.0 pg   MCHC 33.4 30.0 - 36.0 g/dL   RDW 69.6 29.5 - 28.4 %   Platelets 276 150 - 400 K/uL   Neutrophils Relative % 62 43 - 77 %   Neutro Abs 4.4 1.7 - 7.7 K/uL   Lymphocytes Relative 27 12 - 46 %   Lymphs Abs 1.9 0.7 - 4.0 K/uL   Monocytes Relative 8 3 - 12 %   Monocytes Absolute 0.5 0.1 - 1.0 K/uL   Eosinophils Relative 3 0 - 5 %   Eosinophils Absolute 0.2 0.0 - 0.7 K/uL   Basophils Relative 0 0 - 1 %   Basophils Absolute 0.0 0.0 - 0.1 K/uL  I-stat troponin, ED (not at [facility])  Result Value Ref Range   Troponin i, poc 0.00 0.00 - 0.08 ng/mL   Comment 3           Imaging Review Dg Chest 2 View  05/18/2014   CLINICAL DATA:  Dizziness and lightheadedness. Syncope. Chest tightness.  EXAM: CHEST  2 VIEW  COMPARISON:  05/17/2014  FINDINGS: The heart size and mediastinal contours are within normal limits. Both lungs are clear. The visualized skeletal structures are unremarkable.  IMPRESSION: No active cardiopulmonary disease.   Electronically Signed   By: Gaylyn Rong M.D.   On: 05/18/2014 15:43   Dg Chest 2 View  05/17/2014   CLINICAL DATA:  Loss of consciousness.  EXAM: CHEST  2 VIEW  COMPARISON:  None.  FINDINGS: The heart size and mediastinal contours are within normal limits. Both lungs are clear. The visualized skeletal structures are unremarkable.  IMPRESSION: No active cardiopulmonary disease.   Electronically Signed   By: Myles Rosenthal M.D.   On: 05/17/2014 16:10     Date: 05/18/2014  Rate: 68  Rhythm: normal sinus rhythm  QRS Axis: normal  Intervals: normal  ST/T Wave abnormalities: normal  Conduction Disutrbances:none  Narrative Interpretation: Possible old anteroseptal myocardial infarction. When compared with ECG of 05/17/2014, no significant changes are seen.  Old EKG Reviewed: unchanged    MDM   Final diagnoses:  Near syncope    Near syncopal episode which has happened twice in 2 days. Old records were  reviewed confirming ED visit yesterday with negative workup. Given short times and between episodes, decision is made to admit him. He did not have liver enzymes done yesterday so hepatic function panel will be checked today. He will also have head CT done today. He may be a candidate for outpatient cardiac monitoring with an event monitor. Case is discussed with Dr. Cena Benton of [facility] who agrees to admit the patient under observation status.    Dione Booze, MD 05/18/14 [phone]

## 2014-05-18 NOTE — ED Notes (Signed)
Pt to CT at this time.

## 2014-05-18 NOTE — ED Notes (Signed)
Attempted report 

## 2014-05-18 NOTE — ED Notes (Signed)
Pt sts seen here for syncope yesterday; pt sts discharged and now feeling lightheaded today; pt denies pain or SOB

## 2014-05-19 ENCOUNTER — Observation Stay (HOSPITAL_COMMUNITY): Payer: BLUE CROSS/BLUE SHIELD

## 2014-05-19 ENCOUNTER — Encounter (HOSPITAL_COMMUNITY): Payer: Self-pay | Admitting: Physician Assistant

## 2014-05-19 DIAGNOSIS — E780 Pure hypercholesterolemia: Secondary | ICD-10-CM | POA: Diagnosis not present

## 2014-05-19 DIAGNOSIS — R55 Syncope and collapse: Secondary | ICD-10-CM

## 2014-05-19 DIAGNOSIS — I712 Thoracic aortic aneurysm, without rupture, unspecified: Secondary | ICD-10-CM | POA: Insufficient documentation

## 2014-05-19 DIAGNOSIS — I7121 Aneurysm of the ascending aorta, without rupture: Secondary | ICD-10-CM | POA: Diagnosis present

## 2014-05-19 DIAGNOSIS — Z79899 Other long term (current) drug therapy: Secondary | ICD-10-CM | POA: Diagnosis not present

## 2014-05-19 HISTORY — DX: Aneurysm of the ascending aorta, without rupture: I71.21

## 2014-05-19 HISTORY — DX: Thoracic aortic aneurysm, without rupture: I71.2

## 2014-05-19 LAB — BASIC METABOLIC PANEL
Anion gap: 4 — ABNORMAL LOW (ref 5–15)
BUN: 8 mg/dL (ref 6–23)
CO2: 30 mmol/L (ref 19–32)
CREATININE: 0.98 mg/dL (ref 0.50–1.35)
Calcium: 9 mg/dL (ref 8.4–10.5)
Chloride: 108 mmol/L (ref 96–112)
GFR calc Af Amer: >90 mL/min (ref >90)
Glucose, Bld: 99 mg/dL (ref 70–99)
Potassium: 3.5 mmol/L (ref 3.5–5.1)
Sodium: 142 mmol/L (ref 135–145)

## 2014-05-19 LAB — TROPONIN I
Troponin I: <0.03 ng/mL (ref <0.031)
Troponin I: <0.03 ng/mL (ref <0.031)

## 2014-05-19 LAB — CBC
HEMATOCRIT: 38.8 % — AB (ref 39.0–52.0)
Hemoglobin: 13 g/dL (ref 13.0–17.0)
MCH: 29.8 pg (ref 26.0–34.0)
MCHC: 33.5 g/dL (ref 30.0–36.0)
MCV: 89 fL (ref 78.0–100.0)
Platelets: 250 10*3/uL (ref 150–400)
RBC: 4.36 MIL/uL (ref 4.22–5.81)
RDW: 12.2 % (ref 11.5–15.5)
WBC: 6.5 10*3/uL (ref 4.0–10.5)

## 2014-05-19 MED ORDER — IOHEXOL 350 MG/ML SOLN
100.0000 mL | Freq: Once | INTRAVENOUS | Status: AC | PRN
  Administered 2014-05-19: 100 mL via INTRAVENOUS

## 2014-05-19 NOTE — Consult Note (Signed)
CARDIOLOGY CONSULT NOTE   Patient ID: Jason Osborn MRN: [id number] DOB/AGE: 1966-01-13 49 y.o.  Admit date: 05/18/2014  Primary Physician   Vivien Presto, MD Primary Cardiologist   New Reason for Consultation   Syncope  NWG:NFAOZ Jason Osborn is a 49 y.o. year old male with a history of HLD, but not HTN, CAD or arrhythmia.   He was in his usual state of health Sunday at church. He had eaten a banana for breakfast and had some water. He was kneeling, and had sudden onset of weakness. He felt hot and became diaphoretic. He felt like he was going to pass out and sat back. His wife realized something was wrong and spoke to him. He fell over into her lap. He was unresponsive for just a few seconds and then woke. There were no palpitations, no chest pain and no shortness of breath. He had immediate awareness of who he was and of those around him. He did not feel bad in any way. This had never happened before. EMS personnel were at the service, and responded. They were initially unable to obtain a pulse and a blood pressure, but part of this was due to technical difficulties. Initially, he had orthostatic dizziness with position changes. By EMS arrival, he was feeling much better and able to ambulate. He was seen in the emergency room, given IV fluids and discharged home. His wife notes that his heart rate was in the 40s when he was in the emergency room, sinus rhythm upon review.  Yesterday, he was getting ready to go to work and had just taken a shower. It was about 11 AM, and he had had a sandwich and a banana. He had a similar episode with weakness, diaphoresis and a hot feeling, but did not completely lose consciousness. He was able to lie down and gradually felt better. He came to the hospital and was admitted. He has not had any additional episodes of weakness or presyncope, but he has a distinct feeling of fatigue that is very different from usual for him. Orthostatics were mildly positive on  admission with a systolic blood pressure went from 108 lying to 98 standing. His heart rate increased from 61 up to 70.    Past Medical History  Diagnosis Date  . Hypercholesteremia      Past Surgical History  Procedure Laterality Date  . Knee arthroscopy      Allergies  Allergen Reactions  . Lipitor [Atorvastatin]     Frequent urination  . Simvastatin     Body aches    I have reviewed the patient's current medications . heparin  5,000 Units Subcutaneous 3 times per day  . rosuvastatin  10 mg Oral QODAY  . sodium chloride  3 mL Intravenous Q12H  . sodium chloride  3 mL Intravenous Q12H     sodium chloride, acetaminophen **OR** acetaminophen, sodium chloride  Prior to Admission medications   Medication Sig Start Date End Date Taking? Authorizing Provider  Ascorbic Acid (VITAMIN C) 1000 MG tablet Take 1,000 mg by mouth daily.   Yes Historical Provider, MD  Multiple Vitamins-Minerals (AIRBORNE PO) Take 1 tablet by mouth daily.   Yes Historical Provider, MD  Multiple Vitamins-Minerals (MULTIVITAMIN WITH MINERALS) tablet Take 1 tablet by mouth daily.   Yes Historical Provider, MD  rosuvastatin (CRESTOR) 10 MG tablet Take 10 mg by mouth every other day.   Yes Historical Provider, MD     History   Social History  .  Marital Status: Married    Spouse Name: N/A  . Number of Children: N/A  . Years of Education: N/A   Occupational History  . Administrator, arts    Social History Main Topics  . Smoking status: Never Smoker   . Smokeless tobacco: Not on file  . Alcohol Use: Yes     Comment: weekend use  . Drug Use: No  . Sexual Activity: Not on file   Other Topics Concern  . Not on file   Social History Narrative   Lives with wife, does yard work on the weekends.    Family Status  Relation Status Death Age  . Mother Alive     No hx CAD or arrhythmia  . Father Alive     Medical history unknown   History reviewed. No pertinent family history.   ROS:  Full 14  point review of systems complete and found to be negative unless listed above.  Physical Exam: Blood pressure 117/75, pulse 62, temperature 98.4 F (36.9 C), temperature source Oral, resp. rate 18, height  (1.854 m), weight 192 lb (87.091 kg), SpO2 98 %.  General: Well developed, well nourished, male in no acute distress Head: Eyes PERRLA, No xanthomas.   Normocephalic and atraumatic, oropharynx without edema or exudate. Dentition: good Lungs: clear bilaterally Heart: HRRR S1 S2, no rub/gallop, Heart irregular rate and rhythm with S1, S2  murmur. pulses are 2+ extrem.   Neck: No carotid bruits. No lymphadenopathy.  JVD. Abdomen: Bowel sounds present, abdomen soft and non-tender without masses or hernias noted. Msk:  No spine or cva tenderness. No weakness, no joint deformities or effusions. Extremities: No clubbing or cyanosis.  edema.  Neuro: Alert and oriented X 3. No focal deficits noted. Psych:  Good affect, responds appropriately Skin: No rashes or lesions noted.  Labs:   Lab Results  Component Value Date   WBC 6.5 05/19/2014   HGB 13.0 05/19/2014   HCT 38.8* 05/19/2014   MCV 89.0 05/19/2014   PLT 250 05/19/2014    Recent Labs Lab 05/18/14 1448 05/19/14 0131  NA 142 142  K 4.2 3.5  CL 108 108  CO2 29 30  BUN 8 8  CREATININE 1.02 0.98  CALCIUM 8.9 9.0  PROT 7.2  --   BILITOT 0.7  --   ALKPHOS 62  --   ALT 36  --   AST 22  --   GLUCOSE 92 99  ALBUMIN 4.1  --    MAGNESIUM  Date Value Ref Range Status  05/18/2014 2.2 1.5 - 2.5 mg/dL Final    Recent Labs  16/10/96 2037 05/19/14 0131 05/19/14 0723  TROPONINI <0.03 <0.03 <0.03    Recent Labs  05/17/14 1340 05/18/14 1503  TROPIPOC 0.00 0.00   TSH  Date/Time Value Ref Range Status  05/18/2014 08:37 PM 1.345 0.350 - 4.500 uIU/mL Final   Echo: 05/19/2014 Conclusions - Left ventricle: The cavity size was normal. Systolic function was normal. The estimated ejection fraction was in the range of  55% to 60%. Wall motion was normal; there were no regional wall motion abnormalities. - Aortic valve: There was mild regurgitation directed eccentrically in the LVOT and towards the mitral anterior leaflet. - Aorta: The aorta was moderately to severely dilated. Ascending aortic diameter: 49 mm (S).  ECG:  05/19/2014 Sinus rhythm, first-degree AV block  Radiology:  Dg Chest 2 View 05/18/2014   CLINICAL DATA:  Dizziness and lightheadedness. Syncope. Chest tightness.  EXAM: CHEST  2 VIEW  COMPARISON:  05/17/2014  FINDINGS: The heart size and mediastinal contours are within normal limits. Both lungs are clear. The visualized skeletal structures are unremarkable.  IMPRESSION: No active cardiopulmonary disease.   Electronically Signed   By: Gaylyn Rong M.D.   On: 05/18/2014 15:43   Ct Head Wo Contrast 05/18/2014   CLINICAL DATA:  Near syncopal episode today. Generalized weakness. No loss of consciousness. Symptoms lasted 20 min before resolving. Similar episode yesterday. Mild chest pain. Nausea yesterday.  EXAM: CT HEAD WITHOUT CONTRAST  TECHNIQUE: Contiguous axial images were obtained from the base of the skull through the vertex without intravenous contrast.  COMPARISON:  None.  FINDINGS: Ventricles and sulci appear symmetrical. No ventricular dilatation. No mass effect or midline shift. No abnormal extra-axial fluid collections. Gray-white matter junctions are distinct. Basal cisterns are not effaced. No evidence of acute intracranial hemorrhage. No depressed skull fractures. Mild mucosal thickening in the paranasal sinuses. Mastoid air cells are not opacified.  IMPRESSION: No acute intracranial abnormalities. Mild mucosal thickening in the paranasal sinuses.   Electronically Signed   By: Burman Nieves M.D.   On: 05/18/2014 17:57   Ct Angio Chest Aorta W/cm &/or Wo/cm 05/19/2014   CLINICAL DATA:  Two syncopal episodes, chest pain, no shortness of breath  EXAM: CT ANGIOGRAPHY CHEST WITH  CONTRAST  TECHNIQUE: Multidetector CT imaging of the chest was performed using the standard protocol during bolus administration of intravenous contrast. Multiplanar CT image reconstructions and MIPs were obtained to evaluate the vascular anatomy.  CONTRAST:  OMNIPAQUE IOHEXOL 350 MG/ML SOLN  COMPARISON:  None.  FINDINGS: There is an ascending thoracic aortic aneurysm measuring 4.8 cm in diameter. The remainder of the thoracic aorta is normal in caliber. There is no thoracic aortic dissection. There is no para-aortic hematoma. The heart size is normal. There is no pericardial effusion.  The lungs are clear. There is no focal consolidation, pleural effusion or pneumothorax.  There is no axillary, hilar, or mediastinal adenopathy.  There is no lytic or blastic osseous lesion.  There is a 12 mm hyperenhancing right hepatic lesion likely reflecting a flash filling hemangioma or arterioportal malformation. There is a similar lesion in the left hepatic lobe measuring 8 mm.  Review of the MIP images confirms the above findings.  IMPRESSION: 1. No thoracic aortic dissection. 2. Ascending thoracic aortic aneurysm. Ascending thoracic aortic aneurysm. Recommend semi-annual imaging followup by CTA or MRA and referral to cardiothoracic surgery if not already obtained. This recommendation follows 2010 ACCF/AHA/AATS/ACR/ASA/SCA/SCAI/SIR/STS/SVM Guidelines for the Diagnosis and Management of Patients With Thoracic Aortic Disease. Circulation. 2010; 121: A213-Y865 3. There is a 12 mm hyperenhancing right hepatic lesion and a similar smaller lobe 8 mm left hepatic lesion likely reflecting a flash filling hemangioma or arterioportal malformation. If there is further clinical concern, further evaluation with a non emergent MRI of the abdomen can be performed.   Electronically Signed   By: Elige Ko   On: 05/19/2014 16:04    ASSESSMENT AND PLAN:   The patient was seen today by Dr. Anne Fu, the patient evaluated and the data  reviewed.  Active Problems:   Near syncope  - see below    Syncope - Sinus bradycardia on telemetry, but heart rate is at least in the 40s and not low enough to cause symptoms - Patient has a history of presyncope and response to an emotionally stressful events but no history of syncope - Patient was mildly dehydrated Sunday and orthostatics were positive yesterday as  well. - Would encourage adequate hydration and liberalize salt intake - Consider compression stockings as well - Echocardiogram was normal - No further inpatient workup is indicated, okay for DC from a cardiac standpoint   Ascending thoracic aortic aneurysm - We will refer to TCTS, but this can be done as an outpatient - He will need to be followed for this but no further inpatient workup is needed  Signed: Theodore DemarkRhonda Barrett, PA-C 05/19/2014 4:36 PM Beeper [phone]  Personally seen and examined. Agree with above. Vasovagal syncope  - hydrate  - salt liberalization  - compression stockings  - if prodrome begins - lay down.   EF normal. Has had predisposition for several years (Wife has known him for 30 years and says that these symptoms/dx are all making sense).  OK for DC. Will follow up with me in future Will set up with [facility] to follow aorta. At 55mm may consider replacement.   - Mild aortic regurg. No mention of bicuspid which sometimes goes hand in hand in dilated aorta.   Donato SchultzSKAINS, Emmamarie Kluender, MD    Donato SchultzSKAINS, Heidemarie Goodnow, MD

## 2014-05-19 NOTE — Progress Notes (Signed)
[facility] PROGRESS NOTE   Jason DuncansJames Bodey VWU:[id number] DOB: 03/26/1965 DOA: 05/18/2014 PCP: Vivien PrestoORRINGTON,KIP A, MD  HPI/Subjective: Denies any complaints, seen with wife at bedside.  Assessment/Plan: Active Problems:   Near syncope   Syncope   Syncope Syncope in otherwise healthy male. Carotid duplex preliminary report showed normal flow. Telemetry and EKG as well as troponin was negative. 2-D echocardiogram showed aortic dilatation, recommend a CT scan to evaluate for aneurysm.  Sinus bradycardia Asymptomatic sinus bradycardia, likely in the syncopal episode not related to it.  Code Status: Full Code Family Communication: Plan discussed with the patient. Disposition Plan: Remains inpatient Diet: Diet Heart  Consultants:  None  Procedures:  None  Antibiotics:  None   Objective: Filed Vitals:   05/19/14 0523  BP:   Pulse:   Temp: 98.4 F (36.9 C)  Resp: 18    Intake/Output Summary (Last 24 hours) at 05/19/14 1351 Last data filed at 05/19/14 0700  Gross per 24 hour  Intake      3 ml  Output    500 ml  Net   -497 ml   Filed Weights   05/18/14 1852 05/19/14 0523  Weight: 87.272 kg (192 lb 6.4 oz) 87.091 kg (192 lb)    Exam: General: Alert and awake, oriented x3, not in any acute distress. HEENT: anicteric sclera, pupils reactive to light and accommodation, EOMI CVS: S1-S2 clear, no murmur rubs or gallops Chest: clear to auscultation bilaterally, no wheezing, rales or rhonchi Abdomen: soft nontender, nondistended, normal bowel sounds, no organomegaly Extremities: no cyanosis, clubbing or edema noted bilaterally Neuro: Cranial nerves II-XII intact, no focal neurological deficits  Data Reviewed: Basic Metabolic Panel:  Recent Labs Lab 05/17/14 1331 05/18/14 1448 05/18/14 2037 05/19/14 0131  NA 141 142  --  142  K 4.1 4.2  --  3.5  CL 108 108  --  108  CO2 29 29  --  30  GLUCOSE 103* 92  --  99  BUN 10 8  --  8  CREATININE 0.97 1.02   --  0.98  CALCIUM 8.4 8.9  --  9.0  MG  --   --  2.2  --   PHOS  --   --  3.2  --    Liver Function Tests:  Recent Labs Lab 05/18/14 1448  AST 22  ALT 36  ALKPHOS 62  BILITOT 0.7  PROT 7.2  ALBUMIN 4.1   No results for input(s): LIPASE, AMYLASE in the last 168 hours. No results for input(s): AMMONIA in the last 168 hours. CBC:  Recent Labs Lab 05/17/14 1331 05/18/14 1448 05/19/14 0131  WBC 8.9 7.0 6.5  NEUTROABS 6.3 4.4  --   HGB 13.6 14.2 13.0  HCT 40.0 42.5 38.8*  MCV 88.3 89.1 89.0  PLT 244 276 250   Cardiac Enzymes:  Recent Labs Lab 05/18/14 2037 05/19/14 0131 05/19/14 0723  TROPONINI <0.03 <0.03 <0.03   BNP (last 3 results) No results for input(s): BNP in the last 8760 hours.  ProBNP (last 3 results) No results for input(s): PROBNP in the last 8760 hours.  CBG: No results for input(s): GLUCAP in the last 168 hours.  Micro No results found for this or any previous visit (from the past 240 hour(s)).   Studies: Dg Chest 2 View  05/18/2014   CLINICAL DATA:  Dizziness and lightheadedness. Syncope. Chest tightness.  EXAM: CHEST  2 VIEW  COMPARISON:  05/17/2014  FINDINGS: The heart size and mediastinal contours are within  normal limits. Both lungs are clear. The visualized skeletal structures are unremarkable.  IMPRESSION: No active cardiopulmonary disease.   Electronically Signed   By: Gaylyn Rong M.D.   On: 05/18/2014 15:43   Dg Chest 2 View  05/17/2014   CLINICAL DATA:  Loss of consciousness.  EXAM: CHEST  2 VIEW  COMPARISON:  None.  FINDINGS: The heart size and mediastinal contours are within normal limits. Both lungs are clear. The visualized skeletal structures are unremarkable.  IMPRESSION: No active cardiopulmonary disease.   Electronically Signed   By: Myles Rosenthal M.D.   On: 05/17/2014 16:10   Ct Head Wo Contrast  05/18/2014   CLINICAL DATA:  Near syncopal episode today. Generalized weakness. No loss of consciousness. Symptoms lasted 20 min  before resolving. Similar episode yesterday. Mild chest pain. Nausea yesterday.  EXAM: CT HEAD WITHOUT CONTRAST  TECHNIQUE: Contiguous axial images were obtained from the base of the skull through the vertex without intravenous contrast.  COMPARISON:  None.  FINDINGS: Ventricles and sulci appear symmetrical. No ventricular dilatation. No mass effect or midline shift. No abnormal extra-axial fluid collections. Gray-white matter junctions are distinct. Basal cisterns are not effaced. No evidence of acute intracranial hemorrhage. No depressed skull fractures. Mild mucosal thickening in the paranasal sinuses. Mastoid air cells are not opacified.  IMPRESSION: No acute intracranial abnormalities. Mild mucosal thickening in the paranasal sinuses.   Electronically Signed   By: Burman Nieves M.D.   On: 05/18/2014 17:57    Scheduled Meds: . heparin  5,000 Units Subcutaneous 3 times per day  . rosuvastatin  10 mg Oral QODAY  . sodium chloride  3 mL Intravenous Q12H  . sodium chloride  3 mL Intravenous Q12H   Continuous Infusions:      Time spent: 35 minutes    [facility]  [facility] Pager [phone] If 7PM-7AM, please contact night-coverage at [email or web address], password [facility] 05/19/2014, 1:51 PM

## 2014-05-19 NOTE — Progress Notes (Signed)
VASCULAR LAB PRELIMINARY  PRELIMINARY  PRELIMINARY  PRELIMINARY  Carotid duplex completed.    Preliminary report:  Bilateral:  1-39% ICA stenosis.  Vertebral artery flow is antegrade.     Jahsir Rama, RVS 05/19/2014, 12:14 PM

## 2014-05-19 NOTE — Discharge Summary (Signed)
Physician Discharge Summary  Jason Osborn AVW:[id number] DOB: Nov 24, 1965 DOA: 05/18/2014  PCP: Vivien Presto, MD  Admit date: 05/18/2014 Discharge date: 05/19/2014  Time spent: 40 minutes  Recommendations for Outpatient Follow-up:  1. Follow up with PCP in 1 week. 2. Needs referral to cardiothoracic surgery as out patient for ascending aorta aneurysm.   Discharge Diagnoses:  Principal Problem:   Syncope Active Problems:   Near syncope   Aneurysm, ascending aorta   Discharge Condition: Stable  Diet recommendation: Heart healthy  Filed Weights   05/18/14 1852 05/19/14 0523  Weight: 87.272 kg (192 lb 6.4 oz) 87.091 kg (192 lb)    History of present illness:  Jason Osborn is a 49 y.o. male  Reports patient being previously healthy. Presents to the hospital after 2 syncopal episodes. Patient evaluated yesterday 05/17/2014 at that point he had presented after syncopal episode while at church. He states he knelt down and passed out. Today he had near syncope and as such presented for further evaluation recommendations. He states that he has had an upper respiratory infection for the last month. He denies no decreased oral intake although he does state that he is more thirsty than usual within the last couple days. Denies any palpitations prior to passing out. Spouse present when patient passed out yesterday and reports no epileptic activity. The patient was out for minutes if not seconds when he came back to. Patient also reports dull chest discomfort that he reports felt like reflux.  Given syncope and near syncopal episodes were consulted for further evaluation recommendations  Hospital Course:   Syncope Syncope in otherwise healthy male. Carotid duplex preliminary report showed normal flow. Telemetry and EKG as well as troponin was negative. 2-D echocardiogram showed aortic dilatation, CTA showed aortic aneurysm. Seen by cardiology recommended discharge.  Sinus  bradycardia Asymptomatic sinus bradycardia, likely in the syncopal episode not related to it.  Aneurysm, ascending aorta Seen incidentally in the 2 D Echo. CTA showed 4.8 cm aneurysm, cardiology recommended out patient follow up with CTS.   Procedures:  2 D Echo showed normal LVEF, mild aortic regurgitation  Consultations:  Cardiology  Discharge Exam: Filed Vitals:   05/19/14 1700  BP: 114/76  Pulse: 71  Temp: 98.4 F (36.9 C)  Resp: 18   General: Alert and awake, oriented x3, not in any acute distress. HEENT: anicteric sclera, pupils reactive to light and accommodation, EOMI CVS: S1-S2 clear, no murmur rubs or gallops Chest: clear to auscultation bilaterally, no wheezing, rales or rhonchi Abdomen: soft nontender, nondistended, normal bowel sounds, no organomegaly Extremities: no cyanosis, clubbing or edema noted bilaterally Neuro: Cranial nerves II-XII intact, no focal neurological deficits   Discharge Instructions   Discharge Instructions    Increase activity slowly    Complete by:  As directed           Current Discharge Medication List    CONTINUE these medications which have NOT CHANGED   Details  Ascorbic Acid (VITAMIN C) 1000 MG tablet Take 1,000 mg by mouth daily.    Multiple Vitamins-Minerals (AIRBORNE PO) Take 1 tablet by mouth daily.    Multiple Vitamins-Minerals (MULTIVITAMIN WITH MINERALS) tablet Take 1 tablet by mouth daily.    rosuvastatin (CRESTOR) 10 MG tablet Take 10 mg by mouth every other day.       Allergies  Allergen Reactions  . Lipitor [Atorvastatin]     Frequent urination  . Simvastatin     Body aches   Follow-up Information    Follow  up with CORRINGTON,KIP A, MD In 1 week.   Specialty:  Family Medicine   Contact information:   [phone] [address] [place] [address] [phone]        The results of significant diagnostics from this hospitalization (including imaging, microbiology, ancillary and  laboratory) are listed below for reference.    Significant Diagnostic Studies: Dg Chest 2 View  05/18/2014   CLINICAL DATA:  Dizziness and lightheadedness. Syncope. Chest tightness.  EXAM: CHEST  2 VIEW  COMPARISON:  05/17/2014  FINDINGS: The heart size and mediastinal contours are within normal limits. Both lungs are clear. The visualized skeletal structures are unremarkable.  IMPRESSION: No active cardiopulmonary disease.   Electronically Signed   By: Gaylyn Rong M.D.   On: 05/18/2014 15:43   Dg Chest 2 View  05/17/2014   CLINICAL DATA:  Loss of consciousness.  EXAM: CHEST  2 VIEW  COMPARISON:  None.  FINDINGS: The heart size and mediastinal contours are within normal limits. Both lungs are clear. The visualized skeletal structures are unremarkable.  IMPRESSION: No active cardiopulmonary disease.   Electronically Signed   By: Myles Rosenthal M.D.   On: 05/17/2014 16:10   Ct Head Wo Contrast  05/18/2014   CLINICAL DATA:  Near syncopal episode today. Generalized weakness. No loss of consciousness. Symptoms lasted 20 min before resolving. Similar episode yesterday. Mild chest pain. Nausea yesterday.  EXAM: CT HEAD WITHOUT CONTRAST  TECHNIQUE: Contiguous axial images were obtained from the base of the skull through the vertex without intravenous contrast.  COMPARISON:  None.  FINDINGS: Ventricles and sulci appear symmetrical. No ventricular dilatation. No mass effect or midline shift. No abnormal extra-axial fluid collections. Gray-white matter junctions are distinct. Basal cisterns are not effaced. No evidence of acute intracranial hemorrhage. No depressed skull fractures. Mild mucosal thickening in the paranasal sinuses. Mastoid air cells are not opacified.  IMPRESSION: No acute intracranial abnormalities. Mild mucosal thickening in the paranasal sinuses.   Electronically Signed   By: Burman Nieves M.D.   On: 05/18/2014 17:57   Ct Angio Chest Aorta W/cm &/or Wo/cm  05/19/2014   CLINICAL DATA:  Two  syncopal episodes, chest pain, no shortness of breath  EXAM: CT ANGIOGRAPHY CHEST WITH CONTRAST  TECHNIQUE: Multidetector CT imaging of the chest was performed using the standard protocol during bolus administration of intravenous contrast. Multiplanar CT image reconstructions and MIPs were obtained to evaluate the vascular anatomy.  CONTRAST:  OMNIPAQUE IOHEXOL 350 MG/ML SOLN  COMPARISON:  None.  FINDINGS: There is an ascending thoracic aortic aneurysm measuring 4.8 cm in diameter. The remainder of the thoracic aorta is normal in caliber. There is no thoracic aortic dissection. There is no para-aortic hematoma. The heart size is normal. There is no pericardial effusion.  The lungs are clear. There is no focal consolidation, pleural effusion or pneumothorax.  There is no axillary, hilar, or mediastinal adenopathy.  There is no lytic or blastic osseous lesion.  There is a 12 mm hyperenhancing right hepatic lesion likely reflecting a flash filling hemangioma or arterioportal malformation. There is a similar lesion in the left hepatic lobe measuring 8 mm.  Review of the MIP images confirms the above findings.  IMPRESSION: 1. No thoracic aortic dissection. 2. Ascending thoracic aortic aneurysm. Ascending thoracic aortic aneurysm. Recommend semi-annual imaging followup by CTA or MRA and referral to cardiothoracic surgery if not already obtained. This recommendation follows 2010 ACCF/AHA/AATS/ACR/ASA/SCA/SCAI/SIR/STS/SVM Guidelines for the Diagnosis and Management of Patients With  Thoracic Aortic Disease. Circulation. 2010; 121: [id number] 3. There is a 12 mm hyperenhancing right hepatic lesion and a similar smaller lobe 8 mm left hepatic lesion likely reflecting a flash filling hemangioma or arterioportal malformation. If there is further clinical concern, further evaluation with a non emergent MRI of the abdomen can be performed.   Electronically Signed   By: Elige KoHetal  Patel   On: 05/19/2014 16:04     Microbiology: No results found for this or any previous visit (from the past 240 hour(s)).   Labs: Basic Metabolic Panel:  Recent Labs Lab 05/17/14 1331 05/18/14 1448 05/18/14 2037 05/19/14 0131  NA 141 142  --  142  K 4.1 4.2  --  3.5  CL 108 108  --  108  CO2 29 29  --  30  GLUCOSE 103* 92  --  99  BUN 10 8  --  8  CREATININE 0.97 1.02  --  0.98  CALCIUM 8.4 8.9  --  9.0  MG  --   --  2.2  --   PHOS  --   --  3.2  --    Liver Function Tests:  Recent Labs Lab 05/18/14 1448  AST 22  ALT 36  ALKPHOS 62  BILITOT 0.7  PROT 7.2  ALBUMIN 4.1   No results for input(s): LIPASE, AMYLASE in the last 168 hours. No results for input(s): AMMONIA in the last 168 hours. CBC:  Recent Labs Lab 05/17/14 1331 05/18/14 1448 05/19/14 0131  WBC 8.9 7.0 6.5  NEUTROABS 6.3 4.4  --   HGB 13.6 14.2 13.0  HCT 40.0 42.5 38.8*  MCV 88.3 89.1 89.0  PLT 244 276 250   Cardiac Enzymes:  Recent Labs Lab 05/18/14 2037 05/19/14 0131 05/19/14 0723  TROPONINI <0.03 <0.03 <0.03   BNP: BNP (last 3 results) No results for input(s): BNP in the last 8760 hours.  ProBNP (last 3 results) No results for input(s): PROBNP in the last 8760 hours.  CBG: No results for input(s): GLUCAP in the last 168 hours.     Signed:  Jerry Haugen A  [facility] 05/19/2014, 5:52 PM

## 2014-05-19 NOTE — Progress Notes (Signed)
UR completed 

## 2014-05-19 NOTE — Progress Notes (Signed)
Echocardiogram 2D Echocardiogram has been performed.  Jason Osborn, Meliton M 05/19/2014, 11:01 AM

## 2014-11-06 DIAGNOSIS — R001 Bradycardia, unspecified: Secondary | ICD-10-CM | POA: Insufficient documentation

## 2014-11-06 DIAGNOSIS — Z87898 Personal history of other specified conditions: Secondary | ICD-10-CM | POA: Insufficient documentation

## 2014-11-06 DIAGNOSIS — E782 Mixed hyperlipidemia: Secondary | ICD-10-CM | POA: Insufficient documentation

## 2014-11-06 DIAGNOSIS — R9409 Abnormal results of other function studies of central nervous system: Secondary | ICD-10-CM | POA: Insufficient documentation

## 2014-11-13 DIAGNOSIS — Z950 Presence of cardiac pacemaker: Secondary | ICD-10-CM | POA: Insufficient documentation

## 2015-08-19 DIAGNOSIS — R42 Dizziness and giddiness: Secondary | ICD-10-CM | POA: Insufficient documentation

## 2015-08-19 DIAGNOSIS — K5900 Constipation, unspecified: Secondary | ICD-10-CM | POA: Insufficient documentation

## 2015-09-28 ENCOUNTER — Telehealth: Payer: Self-pay | Admitting: Rehabilitative and Restorative Service Providers"

## 2015-09-28 NOTE — Telephone Encounter (Signed)
PT called [phone]) [phone] for [name] at [facility] re: Jason NoonLevine protocol for POTS.  Awaiting further information.

## 2015-09-29 ENCOUNTER — Ambulatory Visit: Payer: BLUE CROSS/BLUE SHIELD | Attending: Cardiology | Admitting: Rehabilitative and Restorative Service Providers"

## 2015-09-29 VITALS — BP 140/88 | HR 77

## 2015-09-29 DIAGNOSIS — R2689 Other abnormalities of gait and mobility: Secondary | ICD-10-CM | POA: Insufficient documentation

## 2015-09-29 DIAGNOSIS — M6281 Muscle weakness (generalized): Secondary | ICD-10-CM | POA: Insufficient documentation

## 2015-09-29 DIAGNOSIS — R2681 Unsteadiness on feet: Secondary | ICD-10-CM | POA: Diagnosis present

## 2015-09-29 NOTE — Patient Instructions (Signed)
Bridge    Lie back, legs bent. Inhale, pressing hips up. Keeping ribs in, lengthen lower back. Exhale, rolling down along spine from top. Repeat _10___ times. Rest and then repeat 10 more.   Do __1__ sessions per day.  http://pm.exer.us/54   Copyright  VHI. All rights reserved.   Straight Leg Raise    Tighten stomach and slowly raise locked right leg __10-12__ inches from floor. Repeat __10__ times per set. Do __2__ sets per session. Do __1__ sessions per day.  http://orth.exer.us/1102   Copyright  VHI. All rights reserved.

## 2015-09-30 NOTE — Therapy (Signed)
[facility] [address] [place], [place], [address] Phone: [phone]   Fax:  [phone]  Physical Therapy Evaluation  Patient Details  Name: Jason Osborn MRN: [id number] Date of Birth: Sep 17, 1965 Referring Provider: Zenovia Jordan, MD  Encounter Date: 09/29/2015      PT End of Session - 09/30/15 1554    Visit Number 1   Number of Visits 10   Date for PT Re-Evaluation 11/29/15   Authorization Type private insurance   PT Start Time 1100   PT Stop Time 1150   PT Time Calculation (min) 50 min   Activity Tolerance Patient tolerated treatment well   Behavior During Therapy [facility] for tasks assessed/performed      Past Medical History  Diagnosis Date  . Hypercholesteremia     Past Surgical History  Procedure Laterality Date  . Knee arthroscopy      Filed Vitals:   09/29/15 1059  BP: 140/88  Pulse: 77  SpO2: 99%         Subjective Assessment - 09/29/15 1059    Subjective The patient reports his initial episode of syncope occurred in 05/2014.  He is scheduled to be seen at dysautonomia clinic at [facility] in upcoming months, however they wanted him to get started with PT ahead of that visit.  He reports the past 4-5 months he has a sensation of legs could give out and feels he is fatigued and lightheaded frequently during the day.  The patient is out of work due to his current symptoms.   He also has scheduled an appointment for [facility] clinic in September 2017.   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    Patient Stated Goals Gain strength in legs.     Currently in Pain? Yes   Pain Score --  general achiness in legs   Pain Location Leg   Pain Orientation Right;Left   Pain Descriptors / Indicators Other (Comment)  weakness; achiness   Pain Type Chronic pain   Pain Onset More than a month ago   Pain Frequency Intermittent   Aggravating Factors  walking            [facility] PT Assessment -  09/29/15 1107    Assessment   Medical Diagnosis syncope   Referring Provider Zenovia Jordan, MD   Onset Date/Surgical Date --  05/2014   Prior Therapy none   Precautions   Precautions Fall   Precaution Comments h/o syncopal episodes   Restrictions   Weight Bearing Restrictions No   Balance Screen   Has the patient fallen in the past 6 months No   Has the patient had a decrease in activity level because of a fear of falling?  Yes  due to syncopal episodes   Is the patient reluctant to leave their home because of a fear of falling?  No  Will grab a cart to hold onto for balance   Home Environment   Living Environment Private residence   Living Arrangements Spouse/significant other   Type of Home House   Prior Function   Level of Independence Independent   ROM / Strength   AROM / PROM / Strength AROM;Strength   AROM   Overall AROM  Within functional limits for tasks performed   Strength   Overall Strength Comments 5/5 bilateral UEs in shoulder flexion, abduction, elbow flexion/extension.  5/5 bilateral hip flexion, knee extension and ankle dorsiflexion.  4/5 bilateral knee flexion and 5/5 bilateral ankle plantarflexion.  Patient gets  lightheaded sensation with standing heel raises.   After heel raises HR=74, spO2=99%.    Ambulation/Gait   Ambulation/Gait Yes   Ambulation Distance (Feet) 1150 Feet   Assistive device None   Gait Pattern Within Functional Limits   Ambulation Surface Level   Gait velocity WNLs   Stairs Yes   Stairs Assistance 6: Modified independent (Device/Increase time)   Stair Management Technique --  uses rails per report   Number of Stairs --  reports avoiding steps at home due to lightheadedness   Gait Comments 6 minutes=1150 feet      FOTO functional status survey=53% ABC scale=31%     THERAPEUTIC EXERCISE: Instructed patient in bridges supine, and straight leg raises for HEP x 10 reps each exercise.       PT Education - 09/30/15  1552    Education provided Yes   Education Details HEP: bridging, straight leg raise   Person(s) Educated Patient   Methods Explanation;Demonstration;Handout   Comprehension Verbalized understanding;Returned demonstration          PT Short Term Goals - 09/30/15 1555    PT SHORT TERM GOAL #1   Title The patient will return demo ability to take heart rate for exercise monitoring.   Baseline Target date 10/30/2015   Time 4   Period Weeks   PT SHORT TERM GOAL #2   Title The patient will return demo HEP for LE strengthening.   Baseline Target date 10/30/2015   Time 4   Period Weeks   PT SHORT TERM GOAL #3   Title The patient will tolerate cardio workout at base pace x 30 minutes 3 days/week.   Baseline Target date 10/30/2015   Time 4   Period Weeks   PT SHORT TERM GOAL #4   Title The patient will verbalize understanding of progression of training program to improve endurance and tolerance to activities.   Baseline Target date 10/30/2015   Time 4   Period Weeks           PT Long Term Goals - 09/30/15 1603    PT LONG TERM GOAL #1   Title The patient will improve activities of balance confidence score from 31.3% to > or equal to 45% to demo improved self perception of mobility.   Baseline Target date 12/01/2015   Time 8   Period Weeks   PT LONG TERM GOAL #2   Title The patient will ambulate on treadmill to demo tolerance to upright exercise x 10 minutes in PT clinic to demo progression through exercise protocol for dysautonomia.   Baseline Target date 12/01/2015   Time 8   Period Weeks   PT LONG TERM GOAL #3   Title The patient will subjectively verbalize ability to self progress through post d/c exercise program.   Baseline Target date 12/01/2015   Time 8   Period Weeks   PT LONG TERM GOAL #4   Title The patient will improve hamstring strength from 4/5 bilateral knee flexion to 5/5.   Baseline Target date 12/01/2015   Time 8   Period Weeks               Plan -  09/29/15 1058    Clinical Impression Statement The patient is a 50 year old male presenting to outpatient physical therapy due to physical deconditioning following syncopal episodes.  He is reporting worsening sensation of legs giving way and having to hold onto spouse for support during community mobility at times and limited confidence with  balance/mobility.  PT to provide education on safe progression of exercises, strengthening for LEs/Core, education on self monitoring of vitals, and gait training to optimize current functional status.   Rehab Potential Good   PT Frequency 2x / week   PT Duration 4 weeks  followed by 1x/week for 4 weeks   PT Treatment/Interventions ADLs/Self Care Home Management;Therapeutic exercise;Therapeutic activities;Gait training;Patient/family education;Balance training;Neuromuscular re-education;Functional mobility training;Stair training   PT Next Visit Plan Teach further HEP; discuss cardio options with recumbent positioning in mind, teach self monitoring of heart rate; establish base pace and check supine resting heart rate    Consulted and Agree with Plan of Care Patient      Patient will benefit from skilled therapeutic intervention in order to improve the following deficits and impairments:  Abnormal gait, Decreased activity tolerance, Decreased strength, Postural dysfunction, Difficulty walking, Decreased endurance  Visit Diagnosis: Other abnormalities of gait and mobility  Unsteadiness on feet  Muscle weakness (generalized)     Problem List Patient Active Problem List   Diagnosis Date Noted  . Aneurysm, ascending aorta (HCC) 05/19/2014  . Near syncope 05/18/2014  . Syncope 05/18/2014    Tijana Walder, PT 09/30/2015, 4:12 PM  [facility] [address] [place], [place], [address] Phone: [phone]   Fax:  [phone]  Name: Jason Osborn MRN: [id number] Date of Birth:  11/24/1965

## 2015-10-04 ENCOUNTER — Telehealth: Payer: Self-pay | Admitting: Rehabilitative and Restorative Service Providers"

## 2015-10-04 NOTE — Telephone Encounter (Signed)
The patient requested that PT provide information for [employer]ability [employer] regarding his physical status (during initial evaluation).   Rehab Tech at [facility] has had multiple phone interactions with company requesting records and faxed those on Friday @ [phone] to [employer] with [facility] for medical records to be sent.    They requested return phone call regarding his status.  PT returned call.  Verified release of information form signed.  Spoke with [name] and answered questions directly from evaluation regarding muscle weakness, dysautonomia, expected recovery (length of plan of care).

## 2015-10-13 ENCOUNTER — Ambulatory Visit: Payer: BLUE CROSS/BLUE SHIELD | Attending: Cardiology | Admitting: Physical Therapy

## 2015-10-13 VITALS — BP 126/82 | HR 70

## 2015-10-13 DIAGNOSIS — M6281 Muscle weakness (generalized): Secondary | ICD-10-CM | POA: Diagnosis present

## 2015-10-13 DIAGNOSIS — R2681 Unsteadiness on feet: Secondary | ICD-10-CM | POA: Insufficient documentation

## 2015-10-13 DIAGNOSIS — R2689 Other abnormalities of gait and mobility: Secondary | ICD-10-CM | POA: Insufficient documentation

## 2015-10-13 NOTE — Therapy (Signed)
[facility] [address] [place], [place], [address] Phone: [phone]   Fax:  [phone]  Physical Therapy Treatment  Patient Details  Name: Jason Osborn MRN: [id number] Date of Birth: 1965/04/18 Referring Provider: Donnamae Jude, MD  Encounter Date: 10/13/2015      PT End of Session - 10/13/15 1312    Visit Number 2   Number of Visits 16   Date for PT Re-Evaluation 11/29/15   Authorization Type private insurance   PT Start Time [phone]   PT Stop Time 0930   PT Time Calculation (min) 42 min   Activity Tolerance Patient tolerated treatment well   Behavior During Therapy [facility] for tasks assessed/performed      Past Medical History:  Diagnosis Date  . Hypercholesteremia     Past Surgical History:  Procedure Laterality Date  . KNEE ARTHROSCOPY      Vitals:   10/13/15 0854 10/13/15 0902 10/13/15 0906  BP: 133/81 132/82 126/82  Pulse: 63 70         Subjective Assessment - 10/13/15 0934    Subjective "I feel lightheaded...but that's kind of my norm now. "  Pt reports no recent syncopal episodes.    Pertinent History Likes to be called "[name]".   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    Patient Stated Goals Gain strength in legs.     Currently in Pain? No/denies          BP HR  Supine x5 min 133/81 63       Max HR 171  95% Max HR 162.45  HRR 108  75% of HRR 81  mid MSS HR 144  MSS training zone 139 - 149  Base Pace zone 119 - 129  Recovery zone < 119  Race Pace zone 150 - 162  Interval training zone 162- 171          Training Zone Mode Min Spent in Zone Distance (miles) Avg HR Max HR RPE Comments  Warm up NuStep 3  98   Resistance 3  Race Pace RPE (but not HR)  5  104  13 Resistance 6  Race Pace RPE (but not HR)  8  102  15 Resistance 7   Cool Down  6  86   Resistance 5 for minutes 8-12         Resistance 3 for minute 12-14                            PT  Education - 10/13/15 1259    Education provided Yes   Education Details Education on importance of exercise as a lifelong committment to manage symptoms. Explained [name], established base pace, and recommended pt start keeping track of heart rate using pulse ox. Explained base pace goal of 119-129 bpm, but advised pt to only perform endurance exercise during supervision of PT at this time. Modified POC to 3x/week.   Person(s) Educated Patient   Methods Explanation;Handout;Verbal cues;Demonstration   Comprehension Verbalized understanding          PT Short Term Goals - 09/30/15 1555      PT SHORT TERM GOAL #1   Title The patient will return demo ability to take heart rate for exercise monitoring.   Baseline Target date 10/30/2015   Time 4   Period Weeks     PT SHORT TERM GOAL #2   Title The patient will return demo HEP for LE  strengthening.   Baseline Target date 10/30/2015   Time 4   Period Weeks     PT SHORT TERM GOAL #3   Title The patient will tolerate cardio workout at base pace x 30 minutes 3 days/week.   Baseline Target date 10/30/2015   Time 4   Period Weeks     PT SHORT TERM GOAL #4   Title The patient will verbalize understanding of progression of training program to improve endurance and tolerance to activities.   Baseline Target date 10/30/2015   Time 4   Period Weeks           PT Long Term Goals - 09/30/15 1603      PT LONG TERM GOAL #1   Title The patient will improve activities of balance confidence score from 31.3% to > or equal to 45% to demo improved self perception of mobility.   Baseline Target date 12/01/2015   Time 8   Period Weeks     PT LONG TERM GOAL #2   Title The patient will ambulate on treadmill to demo tolerance to upright exercise x 10 minutes in PT clinic to demo progression through exercise protocol for dysautonomia.   Baseline Target date 12/01/2015   Time 8   Period Weeks     PT LONG TERM GOAL #3   Title The patient will  subjectively verbalize ability to self progress through post d/c exercise program.   Baseline Target date 12/01/2015   Time 8   Period Weeks     PT LONG TERM GOAL #4   Title The patient will improve hamstring strength from 4/5 bilateral knee flexion to 5/5.   Baseline Target date 12/01/2015   Time 8   Period Weeks               Plan - 10/13/15 1313    Clinical Impression Statement Initiated education on longterm exercise to manage symptoms. Established base pace (HR of 119-129 bpm) for [name] based on resting HR during this session. Remainder of session focused on performing an abbreviated (due to time constraint) Training Mode 1. Did not reach base pace HR during exercise, as HR was 104 with RPE of 15.  Pt does not have a gym membership at this time. Therefore, will modify POC to 3x/week to initiate endurance and strengthening regimen.   Rehab Potential Good   PT Frequency 3x / week   PT Duration 4 weeks   PT Treatment/Interventions ADLs/Self Care Home Management;Therapeutic exercise;Therapeutic activities;Gait training;Patient/family education;Balance training;Neuromuscular re-education;Functional mobility training;Stair training   PT Next Visit Plan Teach further HEP - may want to clarify appropriate strength training, as Benjie Karvonen forgot to address this. Remind pt to avoid lying down/reclining after exercise. Attempt to exercise at base pace HR of 119-129 bpm (rather than stopping at RPE of 15) at next session.    Consulted and Agree with Plan of Care Patient      Patient will benefit from skilled therapeutic intervention in order to improve the following deficits and impairments:  Abnormal gait, Decreased activity tolerance, Decreased strength, Postural dysfunction, Difficulty walking, Decreased endurance  Visit Diagnosis: Other abnormalities of gait and mobility  Unsteadiness on feet  Muscle weakness (generalized)     Problem List Patient Active Problem List    Diagnosis Date Noted  . Aneurysm, ascending aorta ([place]) 05/19/2014  . Near syncope 05/18/2014  . Syncope 05/18/2014   Billie Ruddy, PT, DPT [facility] [address] [place], [place],  [address] Phone: [phone]   Fax:  [phone] 10/13/15, 8:26 PM  Name: Jason Osborn MRN: [id number] Date of Birth: 04/15/1965

## 2015-10-14 ENCOUNTER — Ambulatory Visit: Payer: BLUE CROSS/BLUE SHIELD | Admitting: Rehabilitative and Restorative Service Providers"

## 2015-10-14 DIAGNOSIS — R2681 Unsteadiness on feet: Secondary | ICD-10-CM

## 2015-10-14 DIAGNOSIS — M6281 Muscle weakness (generalized): Secondary | ICD-10-CM

## 2015-10-14 DIAGNOSIS — R2689 Other abnormalities of gait and mobility: Secondary | ICD-10-CM

## 2015-10-14 NOTE — Therapy (Signed)
[facility] [address] [place], [place], [address] Phone: [phone]   Fax:  [phone]  Physical Therapy Treatment  Patient Details  Name: Jason Osborn MRN: [id number] Date of Birth: 04-27-1965 Referring Provider: Donnamae Jude, MD  Encounter Date: 10/14/2015      PT End of Session - 10/14/15 2224    Visit Number 3   Number of Visits 16   Date for PT Re-Evaluation 11/29/15   Authorization Type private insurance   PT Start Time [phone]   PT Stop Time 1018   PT Time Calculation (min) 44 min   Activity Tolerance Patient tolerated treatment well   Behavior During Therapy [facility] for tasks assessed/performed      Past Medical History:  Diagnosis Date  . Hypercholesteremia     Past Surgical History:  Procedure Laterality Date  . KNEE ARTHROSCOPY      There were no vitals filed for this visit.      Subjective Assessment - 10/14/15 1001    Subjective The patient reports that he tried to stay upright after therapy and not lie down, but at 6pm he had to nap.  He reports continued ligthheadedness.   Pertinent History Likes to be called "Jason Osborn".   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    Patient Stated Goals Gain strength in legs.     Currently in Pain? No/denies        BP HR  Supine x5 min 133/81 63       Max HR 171  95% Max HR 162.45  HRR 108  75% of HRR 81  mid MSS HR 144  MSS training zone 139 - 149  Base Pace zone 119 - 129  Recovery zone < 119  Race Pace zone 150 - 162  Interval training zone 162- 171      Training zone Mode Resistance  Minutes in Zone Distance Avg HR Max HR Comments  Resting  seated    84  feels lightheaded at rest  warm-up  Sci-fit 3 3  93  uses UE/LE at comfortable pace  Base Pace  5 5 min mark (+2 in training)  116  RPM of 114    4 8 min mark (+5 in training)  129  expresses fatigue/weakness in legs    4 12 min mark (+9 in training)  125  fatigue, no increase in  lightheadedness    4 14 min mark (+11 in training)  125    Cool down  2 17 min mark (+3 cool down)  106      2 20 min mark (+6 cool down)  98  expresses increased lightheadedness rating it 2/10 severity           Strengthening leg press 80 10 reps  102  easy to accomplish 10 reps    100 10 reps    Increased resistance.                           [facility] Adult PT Treatment/Exercise - 10/14/15 1002      Self-Care   Self-Care Other Self-Care Comments                  PT Short Term Goals - 09/30/15 1555      PT SHORT TERM GOAL #1   Title The patient will return demo ability to take heart rate for exercise monitoring.   Baseline Target date 10/30/2015   Time 4  Period Weeks     PT SHORT TERM GOAL #2   Title The patient will return demo HEP for LE strengthening.   Baseline Target date 10/30/2015   Time 4   Period Weeks     PT SHORT TERM GOAL #3   Title The patient will tolerate cardio workout at base pace x 30 minutes 3 days/week.   Baseline Target date 10/30/2015   Time 4   Period Weeks     PT SHORT TERM GOAL #4   Title The patient will verbalize understanding of progression of training program to improve endurance and tolerance to activities.   Baseline Target date 10/30/2015   Time 4   Period Weeks           PT Long Term Goals - 09/30/15 1603      PT LONG TERM GOAL #1   Title The patient will improve activities of balance confidence score from 31.3% to > or equal to 45% to demo improved self perception of mobility.   Baseline Target date 12/01/2015   Time 8   Period Weeks     PT LONG TERM GOAL #2   Title The patient will ambulate on treadmill to demo tolerance to upright exercise x 10 minutes in PT clinic to demo progression through exercise protocol for dysautonomia.   Baseline Target date 12/01/2015   Time 8   Period Weeks     PT LONG TERM GOAL #3   Title The patient will subjectively verbalize ability to self progress through post d/c  exercise program.   Baseline Target date 12/01/2015   Time 8   Period Weeks     PT LONG TERM GOAL #4   Title The patient will improve hamstring strength from 4/5 bilateral knee flexion to 5/5.   Baseline Target date 12/01/2015   Time 8   Period Weeks               Plan - 10/14/15 2228    Clinical Impression Statement The patient was able to reach base pace today and begin further strengthening in the clinic.  He does experience fatigue and lightheadedness with exercise.  Vitals were monitored throughout session.    PT Treatment/Interventions ADLs/Self Care Home Management;Therapeutic exercise;Therapeutic activities;Gait training;Patient/family education;Balance training;Neuromuscular re-education;Functional mobility training;Stair training   PT Next Visit Plan Continue working to 10 minutes at base pace and progressing strengthening with patient's boflex machine at home.   Consulted and Agree with Plan of Care Patient      Patient will benefit from skilled therapeutic intervention in order to improve the following deficits and impairments:  Abnormal gait, Decreased activity tolerance, Decreased strength, Postural dysfunction, Difficulty walking, Decreased endurance  Visit Diagnosis: Other abnormalities of gait and mobility  Unsteadiness on feet  Muscle weakness (generalized)     Problem List Patient Active Problem List   Diagnosis Date Noted  . Aneurysm, ascending aorta ([place]) 05/19/2014  . Near syncope 05/18/2014  . Syncope 05/18/2014    Omarr Hann, PT 10/14/2015, 10:32 PM  [facility] [address] [place], [place], [address] Phone: [phone]   Fax:  [phone]  Name: Jason Osborn MRN: [id number] Date of Birth: [birth year]

## 2015-10-14 NOTE — Addendum Note (Signed)
Addended by: Margretta Ditty M on: 10/14/2015 10:46 PM   Modules accepted: Orders

## 2015-10-15 ENCOUNTER — Ambulatory Visit: Payer: BLUE CROSS/BLUE SHIELD | Admitting: Physical Therapy

## 2015-10-15 DIAGNOSIS — R2689 Other abnormalities of gait and mobility: Secondary | ICD-10-CM

## 2015-10-15 DIAGNOSIS — M6281 Muscle weakness (generalized): Secondary | ICD-10-CM

## 2015-10-15 DIAGNOSIS — R2681 Unsteadiness on feet: Secondary | ICD-10-CM

## 2015-10-15 NOTE — Therapy (Signed)
[facility] [address] [place], [place], [address] Phone: [phone]   Fax:  [phone]  Physical Therapy Treatment  Patient Details  Name: Jason Osborn MRN: [id number] Date of Birth: December 10, 1965 Referring Provider: Donnamae Jude, MD  Encounter Date: 10/15/2015      PT End of Session - 10/15/15 1317    Visit Number 4   Number of Visits 16   Date for PT Re-Evaluation 11/29/15   Authorization Type private insurance   PT Start Time [phone]   PT Stop Time 0935   PT Time Calculation (min) 46 min   Activity Tolerance Patient tolerated treatment well   Behavior During Therapy [facility] for tasks assessed/performed      Past Medical History:  Diagnosis Date  . Hypercholesteremia     Past Surgical History:  Procedure Laterality Date  . KNEE ARTHROSCOPY      There were no vitals filed for this visit.      Subjective Assessment - 10/15/15 1321    Subjective Pt reports he was able to stay up after yesterday's PT session. Reports ongoing 2/10 lightheadedness at baseline.   Pertinent History Likes to be called "[name]".   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    Patient Stated Goals Gain strength in legs.     Currently in Pain? No/denies                         [facility] Adult PT Treatment/Exercise - 10/14/15 1002      Self-Care   Self-Care Other Self-Care Comments                  PT Short Term Goals - 09/30/15 1555      PT SHORT TERM GOAL #1   Title The patient will return demo ability to take heart rate for exercise monitoring.   Baseline Target date 10/30/2015   Time 4   Period Weeks     PT SHORT TERM GOAL #2   Title The patient will return demo HEP for LE strengthening.   Baseline Target date 10/30/2015   Time 4   Period Weeks     PT SHORT TERM GOAL #3   Title The patient will tolerate cardio workout at base pace x 30 minutes 3 days/week.   Baseline Target date 10/30/2015   Time 4    Period Weeks     PT SHORT TERM GOAL #4   Title The patient will verbalize understanding of progression of training program to improve endurance and tolerance to activities.   Baseline Target date 10/30/2015   Time 4   Period Weeks           PT Long Term Goals - 09/30/15 1603      PT LONG TERM GOAL #1   Title The patient will improve activities of balance confidence score from 31.3% to > or equal to 45% to demo improved self perception of mobility.   Baseline Target date 12/01/2015   Time 8   Period Weeks     PT LONG TERM GOAL #2   Title The patient will ambulate on treadmill to demo tolerance to upright exercise x 10 minutes in PT clinic to demo progression through exercise protocol for dysautonomia.   Baseline Target date 12/01/2015   Time 8   Period Weeks     PT LONG TERM GOAL #3   Title The patient will subjectively verbalize ability to self progress through post d/c exercise program.  Baseline Target date 12/01/2015   Time 8   Period Weeks     PT LONG TERM GOAL #4   Title The patient will improve hamstring strength from 4/5 bilateral knee flexion to 5/5.   Baseline Target date 12/01/2015   Time 8   Period Weeks     Max HR 171  95% Max HR 162.45  HRR 108  75% of HRR 81  mid MSS HR 144  MSS training zone 139 - 149  Base Pace zone 119 - 129  Recovery zone < 119  Race Pace zone 150 - 162  Interval training zone 162- 171       Training zone Mode Resistance  Minutes in Zone Distance Avg HR Max HR Comments  Resting  seated    81  2/10 lightheadedness at rest  Warm-up  Sci-fit 3 3  100      4 5 min mark   112    Base Pace  4.5 6 min mark   124  expresses fatigue/weakness in legs    4.5 8 min mark (+2 min training)   123      4.2 9.5 min mark (+3.5 min training)  124  "I feel more lightheaded when I stop, believe it or not."    4.0 14 min mark (+8 min training)  126  93 RPM; Decreased resistance to 4.0    3.6 19 min mark (+13 min training)   129  Decreased to 3.6 resistance due to HR at top of base pace range    3.5 20 min mark (+14 min training)  125      3.5 24 min mark (+18 min training)  121  100 RPM    3.5 29 min mark (+23 min training)  136  Decreased resistance to 2.0  Cool down  2.0 30 min mark (+1 min cool down)  109  5/10 lightheadedness; decreased resistance to 1.7    1.7 34 min mark (+5 cool down)  115  3/10 lightheadedness; Decreased resistance to 1.3    1.3 37 min mark (+8 cool down)  110      Strengthening Mode wt reps  HR  Comments   Leg press 100 1 x10 reps  98      110 2 x10 reps  105      Post-session (seated) HR = 96 bpm         Plan - 10/15/15 1317    Clinical Impression Statement Pt performed recumbent exercise at base pace x23 consecutive minutes. Pt reported 5/10 lightheadedness during workout, but symptoms returned to 3/10 (baseline was 2/10) prior to end of session.    Rehab Potential Good   PT Frequency 3x / week   PT Duration 4 weeks  followed by 1x/week for 4 weeks   PT Treatment/Interventions ADLs/Self Care Home Management;Therapeutic exercise;Therapeutic activities;Gait training;Patient/family education;Balance training;Neuromuscular re-education;Functional mobility training;Stair training   PT Next Visit Plan Continue working toward 30 minutes at base pace and progressing strengthening with patient's boflex machine at home.   Consulted and Agree with Plan of Care Patient      Patient will benefit from skilled therapeutic intervention in order to improve the following deficits and impairments:  Abnormal gait, Decreased activity tolerance, Decreased strength, Postural dysfunction, Difficulty walking, Decreased endurance  Visit Diagnosis: Other abnormalities of gait and mobility  Unsteadiness on feet  Muscle weakness (generalized)     Problem List Patient Active Problem List   Diagnosis Date Noted  . Aneurysm,  ascending aorta ([place]) 05/19/2014  . Near syncope  05/18/2014  . Syncope 05/18/2014    Billie Ruddy, PT, DPT [facility] [address] [place], [place], [address] Phone: [phone]   Fax:  [phone] 10/15/15, 1:22 PM  Name: Jason Osborn MRN: [id number] Date of Birth: Feb 22, 1966

## 2015-10-18 ENCOUNTER — Ambulatory Visit: Payer: BLUE CROSS/BLUE SHIELD | Admitting: Rehabilitative and Restorative Service Providers"

## 2015-10-18 DIAGNOSIS — M6281 Muscle weakness (generalized): Secondary | ICD-10-CM

## 2015-10-18 DIAGNOSIS — R2681 Unsteadiness on feet: Secondary | ICD-10-CM

## 2015-10-18 DIAGNOSIS — R2689 Other abnormalities of gait and mobility: Secondary | ICD-10-CM

## 2015-10-18 NOTE — Therapy (Signed)
[facility] [address] [place], [place], [address] Phone: [phone]   Fax:  [phone]  Physical Therapy Treatment  Patient Details  Name: Jason Osborn MRN: [id number] Date of Birth: August 20, 1965 Referring Provider: Zenovia Jordanamille Genise Frazier-Mills, MD  Encounter Date: 10/18/2015      PT End of Session - 10/18/15 0913    Visit Number 5   Number of Visits 16   Date for PT Re-Evaluation 11/29/15   Authorization Type private insurance   PT Start Time 0848   PT Stop Time 0933   PT Time Calculation (min) 45 min   Activity Tolerance Patient tolerated treatment well   Behavior [facility] for tasks assessed/performed      Past Medical History:  Diagnosis Date  . Hypercholesteremia     Past Surgical History:  Procedure Laterality Date  . KNEE ARTHROSCOPY      There were no vitals filed for this visit.      Subjective Assessment - 10/18/15 0908    Subjective The patient was fatigued over the weekend.  He attempted to stay upright and did not go to bed during waking hours.    Pertinent History Likes to be called "Jason Osborn".   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    Patient Stated Goals Gain strength in legs.     Currently in Pain? No/denies      7-Aug         Training zone Mode Resistance  Minutes in Zone Distance  HR  Comments  Resting  seated    78  fatigue today; tried to stay upright during awake hours  warm-up  Sci-fit 0 6  90  uses UE/LE at comfortable pace     10  86    Base Pace  4 5  117  patient increased pace to get HR up to base pace    4 10  130  reports leg fatigue    4 15  128      4 20  128    Cool down  0 5  101      0 10  100 7 lightheaded when cooling down rates 2-3/10            HR at end of session=88bpm    SELF CARE/HOME MANAGEMENT: Discussed benefits of accessing cardio equipment at home and how to progress. Recommended/educated on strength training plan in [place] protocol in which he  completed 10 reps 1st set, and 2nd set completes as many reps as able (increase weights if able to complete >10 reps during second set).   Discussed heart rate monitoring.      PT Education - 10/18/15 0910    Education provided Yes   Education Details Discussed PT plan with intention to reduce frequency when he gains access to gym equipment and we know vitals respond appropriately to base pace.           PT Short Term Goals - 09/30/15 1555      PT SHORT TERM GOAL #1   Title The patient will return demo ability to take heart rate for exercise monitoring.   Baseline Target date 10/30/2015   Time 4   Period Weeks     PT SHORT TERM GOAL #2   Title The patient will return demo HEP for LE strengthening.   Baseline Target date 10/30/2015   Time 4   Period Weeks     PT SHORT TERM GOAL #3   Title The patient  will tolerate cardio workout at base pace x 30 minutes 3 days/week.   Baseline Target date 10/30/2015   Time 4   Period Weeks     PT SHORT TERM GOAL #4   Title The patient will verbalize understanding of progression of training program to improve endurance and tolerance to activities.   Baseline Target date 10/30/2015   Time 4   Period Weeks           PT Long Term Goals - 09/30/15 1603      PT LONG TERM GOAL #1   Title The patient will improve activities of balance confidence score from 31.3% to > or equal to 45% to demo improved self perception of mobility.   Baseline Target date 12/01/2015   Time 8   Period Weeks     PT LONG TERM GOAL #2   Title The patient will ambulate on treadmill to demo tolerance to upright exercise x 10 minutes in PT clinic to demo progression through exercise protocol for dysautonomia.   Baseline Target date 12/01/2015   Time 8   Period Weeks     PT LONG TERM GOAL #3   Title The patient will subjectively verbalize ability to self progress through post d/c exercise program.   Baseline Target date 12/01/2015   Time 8   Period Weeks     PT  LONG TERM GOAL #4   Title The patient will improve hamstring strength from 4/5 bilateral knee flexion to 5/5.   Baseline Target date 12/01/2015   Time 8   Period Weeks               Plan - 10/18/15 9147    Clinical Impression Statement The patient is tolerating progression of [name] well and adhering to recommendations to avoid lying down during wake hours over weekend.     PT Treatment/Interventions ADLs/Self Care Home Management;Therapeutic exercise;Therapeutic activities;Gait training;Patient/family education;Balance training;Neuromuscular re-education;Functional mobility training;Stair training   PT Next Visit Plan Continue working toward 30 minutes at base pace and progressing strengthening with patient's boflex machine at home.   Consulted and Agree with Plan of Care Patient      Patient will benefit from skilled therapeutic intervention in order to improve the following deficits and impairments:  Abnormal gait, Decreased activity tolerance, Decreased strength, Postural dysfunction, Difficulty walking, Decreased endurance  Visit Diagnosis: Other abnormalities of gait and mobility  Unsteadiness on feet  Muscle weakness (generalized)     Problem List Patient Active Problem List   Diagnosis Date Noted  . Aneurysm, ascending aorta ([facility]) 05/19/2014  . Near syncope 05/18/2014  . Syncope 05/18/2014    Koleen Celia, PT 10/18/2015, 9:24 AM  [facility] [address] [place], [place], [address] Phone: [phone]   Fax:  [phone]  Name: Jason Osborn MRN: [id number] Date of Birth: 1965/05/16

## 2015-10-20 ENCOUNTER — Ambulatory Visit: Payer: BLUE CROSS/BLUE SHIELD | Admitting: Rehabilitative and Restorative Service Providers"

## 2015-10-20 DIAGNOSIS — M6281 Muscle weakness (generalized): Secondary | ICD-10-CM

## 2015-10-20 DIAGNOSIS — R2689 Other abnormalities of gait and mobility: Secondary | ICD-10-CM | POA: Diagnosis not present

## 2015-10-20 DIAGNOSIS — R2681 Unsteadiness on feet: Secondary | ICD-10-CM

## 2015-10-20 NOTE — Therapy (Signed)
[facility] [address] [place], [place], [address] Phone: [phone]   Fax:  [phone]  Physical Therapy Treatment  Patient Details  Name: Jason Osborn MRN: [id number] Date of Birth: 25-Mar-1965 Referring Provider: Donnamae Jude, MD  Encounter Date: 10/20/2015      PT End of Session - 10/20/15 0947    Visit Number 6   Number of Visits 16   Date for PT Re-Evaluation 11/29/15   Authorization Type private insurance   PT Start Time 0930   PT Stop Time 4496   PT Time Calculation (min) 45 min   Activity Tolerance Patient tolerated treatment well  c/o fatigue   Behavior During Therapy [facility] for tasks assessed/performed      Past Medical History:  Diagnosis Date  . Hypercholesteremia     Past Surgical History:  Procedure Laterality Date  . KNEE ARTHROSCOPY      There were no vitals filed for this visit.      Subjective Assessment - 10/20/15 0933    Subjective The patient reports fatigue is significant.  He is working hard to stay upright during day.  He is joining a [employer] education careers [employer].    Pertinent History Likes to be called "Jason Osborn".   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    Patient Stated Goals Gain strength in legs.     Currently in Pain? No/denies      9-Aug         Training zone Mode Resistance  Minutes in Zone Distance Avg HR  Comments  Resting  seated    88  fatigue today; tried to stay upright during awake hours  warm-up  Sci-fit 0 5  96  uses UE/LE at comfortable pace     10  90    Base Pace  4 5  128 91 rpm patient increased pace to get HR up to base pace    4 10  128 90     4 15  128 88     4 20  131 88      25  127 86      30  127 91 fatigue noted; mild lightheadedness noted at start of cool down  Cool down  0 5  104 80     0 10   80 patient completed session on his own for cool down    SELF CARE/HOME MANAGEMENT: Discussed progression to gym routine *see patient education*       PT  Education - 10/20/15 0945    Education provided Yes   Education Details Discussed access to gym and PT change in frequency and goals of PT.  Plan for patient to do cardio at gym and strength/core training at PT clinic with education on progression of program modifying depending on patient response/tolerance.   Person(s) Educated Patient   Methods Explanation   Comprehension Verbalized understanding          PT Short Term Goals - 10/20/15 0948      PT SHORT TERM GOAL #1   Title The patient will return demo ability to take heart rate for exercise monitoring.   Baseline Target date 10/30/2015   Time 4   Period Weeks     PT SHORT TERM GOAL #2   Title The patient will return demo HEP for LE strengthening.   Baseline Target date 10/30/2015   Time 4   Period Weeks     PT SHORT TERM GOAL #3   Title The patient  will tolerate cardio workout at base pace x 30 minutes 3 days/week.   Baseline Target date 10/30/2015   Time 4   Period Weeks     PT SHORT TERM GOAL #4   Title The patient will verbalize understanding of progression of training program to improve endurance and tolerance to activities.   Baseline Met on 10/20/2015 with patient able to verbalize program and transition to gym for cardio routine.     Time 4   Period Weeks   Status Achieved           PT Long Term Goals - 09/30/15 1603      PT LONG TERM GOAL #1   Title The patient will improve activities of balance confidence score from 31.3% to > or equal to 45% to demo improved self perception of mobility.   Baseline Target date 12/01/2015   Time 8   Period Weeks     PT LONG TERM GOAL #2   Title The patient will ambulate on treadmill to demo tolerance to upright exercise x 10 minutes in PT clinic to demo progression through exercise protocol for dysautonomia.   Baseline Target date 12/01/2015   Time 8   Period Weeks     PT LONG TERM GOAL #3   Title The patient will subjectively verbalize ability to self progress through  post d/c exercise program.   Baseline Target date 12/01/2015   Time 8   Period Weeks     PT LONG TERM GOAL #4   Title The patient will improve hamstring strength from 4/5 bilateral knee flexion to 5/5.   Baseline Target date 12/01/2015   Time 8   Period Weeks               Plan - 10/20/15 3295    Clinical Impression Statement The patient is working through severe fatigue this week and trying to stay upright during waking hours.  He now has access to gym and we will transition to him performing cardio at gym and PTs role transitioning to strengthening of LEs and core in therapy and education/assistance to progress safely through [place] training program in order to improve functional mobility.     PT Frequency 2x / week  dec'd frequency since now has access to cardio equipment at gym   PT Duration 4 weeks   PT Treatment/Interventions ADLs/Self Care Home Management;Therapeutic exercise;Therapeutic activities;Gait training;Patient/family education;Balance training;Neuromuscular re-education;Functional mobility training;Stair training   PT Next Visit Plan Transition to cardio at gym; PT to perform LE strengthening/core ativities (plank, side plank, quadriped, advanced bridging activities) in clinic.  Plan to add recovery workout x 40 minutes next week per week 2 of Month 1.     Consulted and Agree with Plan of Care Patient      Patient will benefit from skilled therapeutic intervention in order to improve the following deficits and impairments:  Abnormal gait, Decreased activity tolerance, Decreased strength, Postural dysfunction, Difficulty walking, Decreased endurance  Visit Diagnosis: Other abnormalities of gait and mobility  Unsteadiness on feet  Muscle weakness (generalized)     Problem List Patient Active Problem List   Diagnosis Date Noted  . Aneurysm, ascending aorta ([place]) 05/19/2014  . Near syncope 05/18/2014  . Syncope 05/18/2014    Jason Osborn,  PT 10/20/2015, 10:21 AM  [facility] [address] [place], [place], [address] Phone: [phone]   Fax:  [phone]  Name: Jason Osborn MRN: [id number] Date of Birth: March 02, 1966

## 2015-10-27 ENCOUNTER — Ambulatory Visit: Payer: BLUE CROSS/BLUE SHIELD | Admitting: Physical Therapy

## 2015-10-27 DIAGNOSIS — R2689 Other abnormalities of gait and mobility: Secondary | ICD-10-CM

## 2015-10-27 DIAGNOSIS — M6281 Muscle weakness (generalized): Secondary | ICD-10-CM

## 2015-10-27 DIAGNOSIS — R2681 Unsteadiness on feet: Secondary | ICD-10-CM

## 2015-10-27 NOTE — Therapy (Signed)
[facility] [address] [place], [place], [address] Phone: [phone]   Fax:  [phone]  Physical Therapy Treatment  Patient Details  Name: Jason Osborn MRN: [id number] Date of Birth: 1965/11/06 Referring Provider: Donnamae Jude, MD  Encounter Date: 10/27/2015      PT End of Session - 10/27/15 1751    Visit Number 7   Number of Visits 16   Date for PT Re-Evaluation 11/29/15   Authorization Type private insurance   PT Start Time [phone]   PT Stop Time 1019   PT Time Calculation (min) 48 min   Activity Tolerance Patient limited by fatigue  rest breaks required between vore strengthening exercises   Behavior During Therapy [facility] for tasks assessed/performed      Past Medical History:  Diagnosis Date  . Hypercholesteremia     Past Surgical History:  Procedure Laterality Date  . KNEE ARTHROSCOPY      There were no vitals filed for this visit.      Subjective Assessment - 10/27/15 0934    Subjective Pt arrived 10 minutes prior to scheduled session to initiate warm-up. Pt states, "I did the cardio Monday at the gym and I did weight training yesterday. At my gym, your heart rate is displayed on the screen, then they send you an email afterwards." Pt reports having performeed a 40 minute recovery exercise session last weekend, but has not performed exercise in MSS yet,   Pertinent History Likes to be called "Jason Osborn".   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    Patient Stated Goals Gain strength in legs.     Currently in Pain? No/denies          16-Aug       Training zone Mode Resistance  Minutes in Zone Avg HR Comments  Resting  seated    Fatigue "not that bad today," per pt.  warm-up Sci-fit 0 10 88   Strengthening - See below       Cool down Sci Fit 0 10 95 2/10 lightheadedness         Exercise Time (seconds) Reps Sets Rest between sets HR  Plank  30  1  93   45  1  102  Single leg  bridge    9 2 (1 per side) No rest between 101  B side plank  30  2 (1 per side) 1.5 minute rest after 2nd set 98  Quadruped LE extension  15  1 88  Quadruped alternating reciprocal UE/LE extension  8  1 105                        PT Education - 10/27/15 1749    Education provided Yes   Education Details Educated pt on core strengthening to perform during [place] Protocol "[employer] Training" days and provided reps/sets performed today; see Pt Instructions for details. Explained MSS and provided HR range.   Person(s) Educated Patient   Methods Explanation;Demonstration;Handout;Verbal cues   Comprehension Returned demonstration;Verbalized understanding          PT Short Term Goals - 10/27/15 1801      PT SHORT TERM GOAL #1   Title The patient will return demo ability to take heart rate for exercise monitoring.   Baseline Met 8/16.   Time 4   Period Weeks   Status Achieved     PT SHORT TERM GOAL #2   Title The patient will return demo HEP for LE  strengthening.   Baseline Target date 10/30/2015   Time 4   Period Weeks   Status On-going     PT SHORT TERM GOAL #3   Title The patient will tolerate cardio workout at base pace x 30 minutes 3 days/week.   Baseline Met 8/16.   Time 4   Period Weeks   Status Achieved     PT SHORT TERM GOAL #4   Title The patient will verbalize understanding of progression of training program to improve endurance and tolerance to activities.   Baseline Met on 10/20/2015 with patient able to verbalize program and transition to gym for cardio routine.     Time 4   Period Weeks   Status Achieved           PT Long Term Goals - 09/30/15 1603      PT LONG TERM GOAL #1   Title The patient will improve activities of balance confidence score from 31.3% to > or equal to 45% to demo improved self perception of mobility.   Baseline Target date 12/01/2015   Time 8   Period Weeks     PT LONG TERM GOAL #2   Title The patient will  ambulate on treadmill to demo tolerance to upright exercise x 10 minutes in PT clinic to demo progression through exercise protocol for dysautonomia.   Baseline Target date 12/01/2015   Time 8   Period Weeks     PT LONG TERM GOAL #3   Title The patient will subjectively verbalize ability to self progress through post d/c exercise program.   Baseline Target date 12/01/2015   Time 8   Period Weeks     PT LONG TERM GOAL #4   Title The patient will improve hamstring strength from 4/5 bilateral knee flexion to 5/5.   Baseline Target date 12/01/2015   Time 8   Period Weeks               Plan - 10/27/15 1752    Clinical Impression Statement Session focused on initiating core strengthening exercise regimen, as patient has performed >2 sessions of recumbent exercise with no difficulty. Pt did demonstrate significant fatigue with core strengthening exercises, requiring frequent rest breaks throughout session. Cancelled appt on Friday, as pt is proficient with recumbent exercise, and provided education on MSS range during recumbent exercise with verbal understanding from pt.      Rehab Potential Good   PT Frequency 2x / week  decreased frequency now since pt has access to cardio equipment at gym   PT Duration 4 weeks   PT Treatment/Interventions ADLs/Self Care Home Management;Therapeutic exercise;Therapeutic activities;Gait training;Patient/family education;Balance training;Neuromuscular re-education;Functional mobility training;Stair training   PT Next Visit Plan Ask about pt using MSS range x25 minutes at gym on Friday (8/18). May want to reassess resting HR (supine x5 minutes) and provide new HR ranges for [name]. PT to perform LE strengthening/core ativities (plank, side plank, quadriped, advanced bridging activities) in clinic.  Plan to add recovery workout x 40 minutes next week per week 2 of Month 1.    Consulted and Agree with Plan of Care Patient      Patient will benefit  from skilled therapeutic intervention in order to improve the following deficits and impairments:  Abnormal gait, Decreased activity tolerance, Decreased strength, Postural dysfunction, Difficulty walking, Decreased endurance  Visit Diagnosis: Other abnormalities of gait and mobility  Unsteadiness on feet  Muscle weakness (generalized)     Problem List Patient Active Problem  List   Diagnosis Date Noted  . Aneurysm, ascending aorta ([place]) 05/19/2014  . Near syncope 05/18/2014  . Syncope 05/18/2014   Billie Ruddy, PT, DPT [facility] [address] [place], [place], [address] Phone: [phone]   Fax:  [phone] 10/27/15, 6:06 PM  Name: Milledge Gerding MRN: [id number] Date of Birth: Mar 04, 1966

## 2015-10-27 NOTE — Patient Instructions (Addendum)
Exercise Time (seconds) Reps Sets Rest between sets HR  Plank  30  1  93   45  1  102  Single leg bridge    9 2 (1 per side) No rest between 101  B side plank  30  2 (1 per side) 1.5 minute rest after 2nd set 98  Quadruped LE extension  15  1 88  Quadruped alternating reciprocal UE/LE extension  8  1 105      Plank    Side plank   Bridging (Single Leg)      Arm / Leg Extension: Alternate (All-Fours)            BP HR Symptoms  Supine x5 min 133/81 63 NA  Standing x1 min      Standing x3 min     Standing x5 min     Standing x10 min          Patient's Age 50         Max HR 171    95% Max HR 162.45    HRR 108    75% of HRR 81    mid MSS HR 144    MSS training zone 139 - 149    Base Pace zone 119 - 129    Recovery zone < 119    Race Pace zone 150 - 162.45    Interval training zone 162.45 - 171

## 2015-10-28 ENCOUNTER — Ambulatory Visit: Payer: BLUE CROSS/BLUE SHIELD | Admitting: Physical Therapy

## 2015-10-28 ENCOUNTER — Telehealth: Payer: Self-pay | Admitting: Physical Therapy

## 2015-10-28 NOTE — Telephone Encounter (Signed)
Received message from [place] [employer] (short-term disability), asking that this PT or Jason Osborn return call. [name] requesting additional information in reference to the [facility] Protocol, when protocol began, how long it should last.  This PT spoke with representative, Lissa HoardSonia, who conveyed information to [place]'s team member, Earlie RavelingBrian Freeman, per request of [name].  This therapist explained that the [facility] Protocol is a 736-month protocol. On PT evaluation (7/19), primary therapist recommended 8 weeks to initiate protocol and monitor patient progress. Explained that frequency of PT will vary based on patient safety with self-progression through protocol during this 8-week plan of care.   Jason Osborn, PT, DPT [facility] [address] [place], [place], [address] Phone: [phone]   Fax:  [phone] 10/28/15, 6:24 PM

## 2015-10-29 ENCOUNTER — Ambulatory Visit: Payer: BLUE CROSS/BLUE SHIELD | Admitting: Physical Therapy

## 2015-11-01 ENCOUNTER — Ambulatory Visit: Payer: BLUE CROSS/BLUE SHIELD | Admitting: Physical Therapy

## 2015-11-01 DIAGNOSIS — R2681 Unsteadiness on feet: Secondary | ICD-10-CM

## 2015-11-01 DIAGNOSIS — R2689 Other abnormalities of gait and mobility: Secondary | ICD-10-CM | POA: Diagnosis not present

## 2015-11-01 DIAGNOSIS — M6281 Muscle weakness (generalized): Secondary | ICD-10-CM

## 2015-11-01 NOTE — Therapy (Signed)
[facility] [address] [place], [place], [address] Phone: [phone]   Fax:  [phone]  Physical Therapy Treatment  Patient Details  Name: Jason Osborn MRN: [id number] Date of Birth: 19-Feb-1966 Referring Provider: Donnamae Jude, MD  Encounter Date: 11/01/2015    Past Medical History:  Diagnosis Date  . Hypercholesteremia     Past Surgical History:  Procedure Laterality Date  . KNEE ARTHROSCOPY      There were no vitals filed for this visit.              21-Aug         Training zone Mode Resistance  Minutes in Zone Distance Avg HR  Comments  Resting  seated    87  fatigued today  warm-up  Sci-fit 0 5  83       10      Core strengthening  See below 0 5      Cool down Sci-fit  5  87       19  78                Exercise Time (seconds) Reps Sets Rest between sets HR  Plank  30  1 30  sec    45  1 no 81   60  1 1 min 90  Single leg bridge    12 2 (1 per side) 1 min   B side plank  45  2 (1 per side) 45 sec 85  Quadruped alternating reciprocal UE/LE extension  12 1  100  Bridging with physioball  8 5 2   73                             PT Short Term Goals - 10/27/15 1801      PT SHORT TERM GOAL #1   Title The patient will return demo ability to take heart rate for exercise monitoring.   Baseline Met 8/16.   Time 4   Period Weeks   Status Achieved     PT SHORT TERM GOAL #2   Title The patient will return demo HEP for LE strengthening.   Baseline Target date 10/30/2015   Time 4   Period Weeks   Status On-going     PT SHORT TERM GOAL #3   Title The patient will tolerate cardio workout at base pace x 30 minutes 3 days/week.   Baseline Met 8/16.   Time 4   Period Weeks   Status Achieved     PT SHORT TERM GOAL #4   Title The patient will verbalize understanding of progression of training program to improve endurance and  tolerance to activities.   Baseline Met on 10/20/2015 with patient able to verbalize program and transition to gym for cardio routine.     Time 4   Period Weeks   Status Achieved           PT Long Term Goals - 09/30/15 1603      PT LONG TERM GOAL #1   Title The patient will improve activities of balance confidence score from 31.3% to > or equal to 45% to demo improved self perception of mobility.   Baseline Target date 12/01/2015   Time 8   Period Weeks     PT LONG TERM GOAL #2   Title The patient will ambulate on treadmill to demo tolerance to upright exercise x 10 minutes in PT  clinic to demo progression through exercise protocol for dysautonomia.   Baseline Target date 12/01/2015   Time 8   Period Weeks     PT LONG TERM GOAL #3   Title The patient will subjectively verbalize ability to self progress through post d/c exercise program.   Baseline Target date 12/01/2015   Time 8   Period Weeks     PT LONG TERM GOAL #4   Title The patient will improve hamstring strength from 4/5 bilateral knee flexion to 5/5.   Baseline Target date 12/01/2015   Time 8   Period Weeks             Patient will benefit from skilled therapeutic intervention in order to improve the following deficits and impairments:     Visit Diagnosis: No diagnosis found.     Problem List Patient Active Problem List   Diagnosis Date Noted  . Aneurysm, ascending aorta ([place]) 05/19/2014  . Near syncope 05/18/2014  . Syncope 05/18/2014   Billie Ruddy, PT, DPT [facility] [address] [place], [place], [address] Phone: [phone]   Fax:  [phone] 11/01/15, 9:45 PM  Name: Jason Osborn MRN: [id number] Date of Birth: 10-Feb-1966

## 2015-11-03 ENCOUNTER — Ambulatory Visit: Payer: BLUE CROSS/BLUE SHIELD | Admitting: Rehabilitative and Restorative Service Providers"

## 2015-11-05 ENCOUNTER — Ambulatory Visit: Payer: BLUE CROSS/BLUE SHIELD | Admitting: Physical Therapy

## 2015-11-08 ENCOUNTER — Ambulatory Visit: Payer: BLUE CROSS/BLUE SHIELD | Admitting: Rehabilitative and Restorative Service Providers"

## 2015-11-10 ENCOUNTER — Ambulatory Visit: Payer: BLUE CROSS/BLUE SHIELD | Admitting: Rehabilitative and Restorative Service Providers"

## 2015-11-10 DIAGNOSIS — M6281 Muscle weakness (generalized): Secondary | ICD-10-CM

## 2015-11-10 DIAGNOSIS — R2689 Other abnormalities of gait and mobility: Secondary | ICD-10-CM | POA: Diagnosis not present

## 2015-11-10 DIAGNOSIS — R2681 Unsteadiness on feet: Secondary | ICD-10-CM

## 2015-11-10 NOTE — Therapy (Signed)
[facility] [address] [place], [place], [address] Phone: [phone]   Fax:  [phone]  Physical Therapy Treatment  Patient Details  Name: Jason Osborn MRN: [id number] Date of Birth: 1965/04/26 Referring Provider: Donnamae Jude, MD  Encounter Date: 11/10/2015      Jason Osborn End of Session - 11/10/15 1044    Visit Number 9   Number of Visits 16   Date for Jason Osborn Re-Evaluation 11/29/15   Authorization Type private insurance   Jason Osborn Start Time 1022  patient arrived early to begin exercise program   Jason Osborn Stop Time 1102   Jason Osborn Time Calculation (min) 40 min   Activity Tolerance Patient limited by fatigue  required rest breaks between core strengthening exercises   Behavior During Therapy [facility] for tasks assessed/performed      Past Medical History:  Diagnosis Date  . Hypercholesteremia     Past Surgical History:  Procedure Laterality Date  . KNEE ARTHROSCOPY      There were no vitals filed for this visit.      Subjective Assessment - 11/10/15 1021    Subjective The patient reports fatigue is worse for past 3 days.  He felt he got to near syncopal level on Friday when on rowing machine at the gym (HR in normal range).     Pertinent History Likes to be called "Jason Osborn".   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    Patient Stated Goals Gain strength in legs.     Currently in Pain? No/denies      Max HR 171  95% Max HR 162.45  HRR 108  75% of HRR 81  mid MSS HR 144  MSS training zone 139 - 149  Base Pace zone 119 - 129  Recovery zone < 119  Race Pace zone 150 - 162  Interval training zone 162- 171      30-Aug         Training zone Mode Resistance  Minutes in Zone Distance Avg HR  Comments  Resting  seated      Patient started on his own prior to start of session.  warm-up  Sci-fit 0 14  83  uses UE/LE at comfortable pace  MSS *goal 25 min  5 5  134  patient increased pace to get HR up to base pace-not able to add more  resistance    5.5 10  152  slowed pace,     5.5 15  150  slowed pace,     5.5 20  147      5.5 25  140  *Maintained 25 min maximal steady state  Cool down  2 5  109  reports lightheadedness that increases during cool down phase of exercise    1 10  96  rates lightheadedness 3/10                  Jason Osborn Education - 11/10/15 1043    Education provided Yes   Education Details Patient provided Month 2 calendar with September dates to know how to progress.  Also reviewed that he needs to exercise at [facility] clinic next week in order to avoid going back to beginning week of month 2 protocol.   Person(s) Educated Patient   Methods Explanation;Handout   Comprehension Verbalized understanding          Jason Osborn Short Term Goals - 11/10/15 1046      Jason Osborn SHORT TERM GOAL #1   Title The patient will return demo ability  to take heart rate for exercise monitoring.   Baseline Met 8/16.   Time 4   Period Weeks   Status Achieved     Jason Osborn SHORT TERM GOAL #2   Title The patient will return demo HEP for LE strengthening.   Baseline Jason Osborn doing strengthening in gym   Time 4   Period Weeks   Status Achieved     Jason Osborn SHORT TERM GOAL #3   Title The patient will tolerate cardio workout at base pace x 30 minutes 3 days/week.   Baseline Met 8/16.   Time 4   Period Weeks   Status Achieved     Jason Osborn SHORT TERM GOAL #4   Title The patient will verbalize understanding of progression of training program to improve endurance and tolerance to activities.   Baseline Met on 10/20/2015 with patient able to verbalize program and transition to gym for cardio routine.     Time 4   Period Weeks   Status Achieved           Jason Osborn Long Term Goals - 09/30/15 1603      Jason Osborn LONG TERM GOAL #1   Title The patient will improve activities of balance confidence score from 31.3% to > or equal to 45% to demo improved self perception of mobility.   Baseline Target date 12/01/2015   Time 8   Period Weeks     Jason Osborn LONG TERM GOAL #2   Title  The patient will ambulate on treadmill to demo tolerance to upright exercise x 10 minutes in Jason Osborn clinic to demo progression through exercise protocol for dysautonomia.   Baseline Target date 12/01/2015   Time 8   Period Weeks     Jason Osborn LONG TERM GOAL #3   Title The patient will subjectively verbalize ability to self progress through post d/c exercise program.   Baseline Target date 12/01/2015   Time 8   Period Weeks     Jason Osborn LONG TERM GOAL #4   Title The patient will improve hamstring strength from 4/5 bilateral knee flexion to 5/5.   Baseline Target date 12/01/2015   Time 8   Period Weeks               Plan - 11/10/15 1050    Clinical Impression Statement The patient met all STGs in P.T.  He is motivated and participating per [employer] protocol in the community and tolerated increase from base pace to max steady state HR during our session.     Jason Osborn Treatment/Interventions ADLs/Self Care Home Management;Therapeutic exercise;Therapeutic activities;Gait training;Patient/family education;Balance training;Neuromuscular re-education;Functional mobility training;Stair training   Jason Osborn Next Visit Plan *patient to be [facility] clinic week of 9/4 and return to Jason Osborn after that. REASSESS vitals after supine x5 minutes and provide new HR ranges based on this.  PROGRESS to upright bike at gym.   Consulted and Agree with Plan of Care Patient      Patient will benefit from skilled therapeutic intervention in order to improve the following deficits and impairments:  Abnormal gait, Decreased activity tolerance, Decreased strength, Postural dysfunction, Difficulty walking, Decreased endurance  Visit Diagnosis: Other abnormalities of gait and mobility  Muscle weakness (generalized)  Unsteadiness on feet     Problem List Patient Active Problem List   Diagnosis Date Noted  . Aneurysm, ascending aorta ([facility]) 05/19/2014  . Near syncope 05/18/2014  . Syncope 05/18/2014    Jason Osborn,  Jason Osborn 11/10/2015, 11:12 AM  [facility] [address]  [place], [place], [address] Phone: [phone]   Fax:  [phone]  Name: Jason Osborn MRN: [id number] Date of Birth: September 02, 1965

## 2015-11-12 ENCOUNTER — Ambulatory Visit: Payer: BLUE CROSS/BLUE SHIELD | Admitting: Rehabilitative and Restorative Service Providers"

## 2015-11-24 ENCOUNTER — Ambulatory Visit: Payer: BLUE CROSS/BLUE SHIELD | Attending: Cardiology | Admitting: Rehabilitative and Restorative Service Providers"

## 2015-11-24 DIAGNOSIS — R2689 Other abnormalities of gait and mobility: Secondary | ICD-10-CM | POA: Diagnosis present

## 2015-11-24 DIAGNOSIS — R2681 Unsteadiness on feet: Secondary | ICD-10-CM | POA: Diagnosis present

## 2015-11-24 DIAGNOSIS — M6281 Muscle weakness (generalized): Secondary | ICD-10-CM | POA: Diagnosis present

## 2015-11-24 NOTE — Patient Instructions (Signed)
Gaze Stabilization: Tip Card 1.Target must remain in focus, not blurry, and appear stationary while head is in motion. 2.Perform exercises with small head movements (45 to either side of midline). 3.Increase speed of head motion so long as target is in focus. 4.If you wear eyeglasses, be sure you can see target through lens (therapist will give specific instructions for bifocal / progressive lenses). 5.These exercises may provoke dizziness or nausea. Work through these symptoms. If too dizzy, slow head movement slightly. Rest between each exercise. 6.Exercises demand concentration; avoid distractions. 7.For safety, perform standing exercises close to a counter, wall, corner, or next to someone.  Copyright  VHI. All rights reserved.  Gaze Stabilization: Standing Feet Apart   Feet shoulder width apart, keeping eyes on target on wall 3 feet away, tilt head down slightly and move head side to side 10 repetitions.  Rest, repeat 3 times/day.   Work up to 30 seconds.  Do 2 sessions per day.  Copyright  VHI. All rights reserved.   Feet Together (Compliant Surface) Varied Arm Positions - Eyes Closed    Stand on compliant surface: __pillow______ with feet together and arms out. Close eyes and visualize upright position. Hold_30___ seconds. Repeat _3___ times per session. Do __2__ sessions per day.  Copyright  VHI. All rights reserved.

## 2015-11-24 NOTE — Therapy (Signed)
[facility] [address] [place], [place], [address] Phone: [phone]   Fax:  [phone]  Physical Therapy Treatment  Patient Details  Name: Jason Osborn MRN: [id number] Date of Birth: [birth year] Referring Provider: Donnamae Jude, MD  Encounter Date: 11/24/2015      PT End of Session - 11/24/15 1119    Visit Number 10   Number of Visits 16   Date for PT Re-Evaluation 11/29/15   Authorization Type private insurance   PT Start Time 1020   PT Stop Time 1105   PT Time Calculation (min) 45 min   Activity Tolerance Patient limited by fatigue  required rest breaks between core strengthening exercises   Behavior During Therapy [facility] for tasks assessed/performed      Past Medical History:  Diagnosis Date  . Hypercholesteremia     Past Surgical History:  Procedure Laterality Date  . KNEE ARTHROSCOPY      There were no vitals filed for this visit.      Subjective Assessment - 11/24/15 1019    Subjective The patient was seen at [facility] clinic last week and had hearing test (dx mild hearing loss- nothing major found), blood draw, sleep study to monitor O2 levels, and office visit with specialist.  The main tests that are needed were deferred until November due to [place].  Patient reports physician unsure why on POTS protocol if he doesn't have POTS.   The patient still gets knee buckling sensation depending on the day.  He had a good 4-5 days and then it was a struggle to get out of bed yesterday.  He reports he is still on schedule.  He reports that the physician at [facility] released him to work without restrictions.  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     Pertinent History Likes to be called "[name]".   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    Patient Stated Goals Gain strength in legs.     Currently in Pain? No/denies           BP HR   Supine x5 min  68   Standing x1 min    83   Standing x3 min  81   Standing x5 min  73   Standing x10 min          Patient's Age 50         Max HR 171    95% Max HR 162.45    HRR 103    75% of HRR 77.25    mid MSS HR 145.25    MSS training zone 140.25 - 150.25   Base Pace zone 120.25 - 130.25   Recovery zone < 120.25    Race Pace zone 151.25 - 162.45   Interval training zone 162.45 - 171            Vestibular Assessment - 11/24/15 1125      Vestibular Assessment   General Observation Patient denies sensation of spinning or dizziness.  Describes a lightheaded sensation.     Vestibulo-Occular Reflex   Comment Head impulse test positive bilaterally for refixation saccade (low amplitude).      Environmental education officer Comment Difficulties with foam standing with eyes closed in corner-- added to HEP near wall for support.                  [facility] Adult PT Treatment/Exercise - 11/24/15 1126  Self-Care   Self-Care Other Self-Care Comments   Other Self-Care Comments  Discussed new additions to HEP.  Discussed managing fatigue with return to work and continuing with [name] protocol as recommended by MD as long as he felt he was demo'ing improved strength and endurance for functional tasks.  Patient does note improvement since beginning program.  Discussed self progression over next 3 weeks and return to clinic prior to f/u at [facility].      Neuro Re-ed    Neuro Re-ed Details  Standing on foam with eyes closed (feet apart progressing to feet together over time).         Vestibular Treatment/Exercise - 11/24/15 1129      Vestibular Treatment/Exercise   Vestibular Treatment Provided Gaze   Gaze Exercises X1 Viewing Horizontal;X1 Viewing Vertical     X1 Viewing Horizontal   Foot Position standing   Comments attempted 30 seconds, but provoked visual blurring, and lightheaded sensation.  Recommended begin with 10 reps for HEP and progress to 30 seconds.     X1 Viewing Vertical   Foot Position  Standing   Comments does not provoke symptoms to degree of horiozntal- recommended horizontal VOR for home.                PT Education - 11/24/15 1117    Education provided Yes   Education Details Discussed maintaining current level of [name] while returning to work (begins full time tomorrow), and then continue progressing once adapted to work schedule.    ADDED HEP: gaze x 1 adaptation and foam with eyes closed.   Person(s) Educated Patient   Methods Explanation;Handout   Comprehension Verbalized understanding;Returned demonstration          PT Short Term Goals - 11/10/15 1046      PT SHORT TERM GOAL #1   Title The patient will return demo ability to take heart rate for exercise monitoring.   Baseline Met 8/16.   Time 4   Period Weeks   Status Achieved     PT SHORT TERM GOAL #2   Title The patient will return demo HEP for LE strengthening.   Baseline Pt doing strengthening in gym   Time 4   Period Weeks   Status Achieved     PT SHORT TERM GOAL #3   Title The patient will tolerate cardio workout at base pace x 30 minutes 3 days/week.   Baseline Met 8/16.   Time 4   Period Weeks   Status Achieved     PT SHORT TERM GOAL #4   Title The patient will verbalize understanding of progression of training program to improve endurance and tolerance to activities.   Baseline Met on 10/20/2015 with patient able to verbalize program and transition to gym for cardio routine.     Time 4   Period Weeks   Status Achieved           PT Long Term Goals - 09/30/15 1603      PT LONG TERM GOAL #1   Title The patient will improve activities of balance confidence score from 31.3% to > or equal to 45% to demo improved self perception of mobility.   Baseline Target date 12/01/2015   Time 8   Period Weeks     PT LONG TERM GOAL #2   Title The patient will ambulate on treadmill to demo tolerance to upright exercise x 10 minutes in PT clinic to demo progression through  exercise protocol for dysautonomia.  Baseline Target date 12/01/2015   Time 8   Period Weeks     PT LONG TERM GOAL #3   Title The patient will subjectively verbalize ability to self progress through post d/c exercise program.   Baseline Target date 12/01/2015   Time 8   Period Weeks     PT LONG TERM GOAL #4   Title The patient will improve hamstring strength from 4/5 bilateral knee flexion to 5/5.   Baseline Target date 12/01/2015   Time 8   Period Weeks               Plan - 11/24/15 1119    Clinical Impression Statement The patient has not undergone full assessment at [facility] due to [place] and having to evacuate.  He did feel that specialist did not support cardiac nature of symptoms as his worse sensations occurred while performing standing visual tracking.  Patient had positive bilateral head impulse test for low amplitude refixation saccade to target indicating diminished use of VOR.  PT added HEP to address gaze adaptation and high level balance as these tasks do reproduce a mild, uncomfortable sensation of lightheadedness and nausea.  PT to f/u in 3 weeks to check progress with return to work, [employer] protocol (as recommended by referring MD), and recent additions to HEP.    PT Treatment/Interventions ADLs/Self Care Home Management;Therapeutic exercise;Therapeutic activities;Gait training;Patient/family education;Balance training;Neuromuscular re-education;Functional mobility training;Stair training   PT Next Visit Plan Patient has progressed to upright bike and is in week 2 of 2nd month of Levine.  Check VOR exercises, balance exercises and discuss progress with return to work   [employer] and Agree with Plan of Care Patient      Patient will benefit from skilled therapeutic intervention in order to improve the following deficits and impairments:  Abnormal gait, Decreased activity tolerance, Decreased strength, Postural dysfunction, Difficulty walking, Decreased  endurance  Visit Diagnosis: Other abnormalities of gait and mobility  Muscle weakness (generalized)  Unsteadiness on feet     Problem List Patient Active Problem List   Diagnosis Date Noted  . Aneurysm, ascending aorta ([place]) 05/19/2014  . Near syncope 05/18/2014  . Syncope 05/18/2014    Carlon Davidson, PT 11/24/2015, 11:31 AM  [facility] [address] [place], [place], [address] Phone: [phone]   Fax:  [phone]  Name: Jason Osborn MRN: [id number] Date of Birth: [birth year]

## 2015-11-26 ENCOUNTER — Ambulatory Visit: Payer: Self-pay | Admitting: Rehabilitative and Restorative Service Providers"

## 2015-12-16 ENCOUNTER — Telehealth: Payer: Self-pay | Admitting: Rehabilitative and Restorative Service Providers"

## 2015-12-16 NOTE — Telephone Encounter (Signed)
Message received to return call to [facility] re: short term disability for Jason DuncansJames Osborn.   Left message today.

## 2015-12-17 ENCOUNTER — Ambulatory Visit: Payer: BLUE CROSS/BLUE SHIELD | Attending: Cardiology | Admitting: Rehabilitative and Restorative Service Providers"

## 2015-12-17 DIAGNOSIS — R2681 Unsteadiness on feet: Secondary | ICD-10-CM | POA: Insufficient documentation

## 2015-12-17 DIAGNOSIS — R2689 Other abnormalities of gait and mobility: Secondary | ICD-10-CM

## 2015-12-17 DIAGNOSIS — M6281 Muscle weakness (generalized): Secondary | ICD-10-CM | POA: Insufficient documentation

## 2015-12-17 NOTE — Patient Instructions (Signed)
Gaze Stabilization: Tip Card 1.Target must remain in focus, not blurry, and appear stationary while head is in motion. 2.Perform exercises with small head movements (45 to either side of midline). 3.Increase speed of head motion so long as target is in focus. 4.If you wear eyeglasses, be sure you can see target through lens (therapist will give specific instructions for bifocal / progressive lenses). 5.These exercises may provoke dizziness or nausea. Work through these symptoms. If too dizzy, slow head movement slightly. Rest between each exercise. 6.Exercises demand concentration; avoid distractions. 7.For safety, perform standing exercises close to a counter, wall, corner, or next to someone.  Copyright  VHI. All rights reserved.   Gaze Stabilization: Sitting    Keeping eyes on target held at arm's length.  Tilt head down slightly and move head side to side for __30__ seconds. Repeat while moving head up and down for ____ seconds. Do __2-3__ sessions per day.  Copyright  VHI. All rights reserved.   Side to Side Head Motion While Multi-Tasking    Perform without assistive device. Walk on solid surface, turn head and eyes to left for __2-3__ steps.  Then, turn head and eyes to opposite side for __2-3__ steps. Repeat sequence __10__ times per session. Do __2__ sessions per day. Do near a wall for support.  Copyright  VHI. All rights reserved.   Up / Down Head Motion While Multi-Tasking    Perform without assistive device. Walk on solid surface, move head and eyes toward ceiling for _2-3___ steps. Then, move head and eyes toward floor for _2-3___ steps. Repeat sequence __10__ times per session. Do __2__ sessions per day. Near a wall (in hallway) for safety.  Copyright  VHI. All rights reserved.

## 2015-12-18 NOTE — Therapy (Signed)
[facility] [address] [place], [place], [address] Phone: [phone]   Fax:  [phone]  Physical Therapy Treatment  Patient Details  Name: Jason Osborn MRN: [id number] Date of Birth: 1965-08-18 Referring Provider: Donnamae Jude, MD  Encounter Date: 12/17/2015      PT End of Session - 12/17/15 1022    Visit Number 11   Number of Visits 16   Date for PT Re-Evaluation 01/17/16   Authorization Type private insurance   PT Start Time 7416   PT Stop Time 1100   PT Time Calculation (min) 45 min   Activity Tolerance Patient limited by fatigue   Behavior During Therapy [facility] for tasks assessed/performed      Past Medical History:  Diagnosis Date  . Hypercholesteremia     Past Surgical History:  Procedure Laterality Date  . KNEE ARTHROSCOPY      There were no vitals filed for this visit.      Subjective Assessment - 12/17/15 1018    Subjective The patient reports he attempted to go back to work and it was too much.  He went back to work 9/14-15, 9/18-9/22.  He was exhausted and spent 3 days this week in the bed.  He is trying to increase his exercise minutes per [name] protocol and he has tried the ellipitical and the treadmill per protocol recommendations.    He had to increase incline on treadmill to get heartrate up to recommended level.  He reports he couldn't turn his head when on the treadmill or he felt off balance.    He has not been able to perform gaze exercises due to making him feel worse.    Pertinent History Likes to be called "[name]".   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    Patient Stated Goals Gain strength in legs.     Currently in Pain? No/denies                Vestibular Assessment - 12/17/15 1039      Symptom Behavior   Type of Dizziness Lightheadedness  fatigue   Frequency of Dizziness --  comes and goes   Duration of Dizziness --  hours   Aggravating Factors No known  aggravating factors   Relieving Factors No known relieving factors  waiting over time     Occulomotor Exam   Occulomotor Alignment Normal   Spontaneous Absent   Gaze-induced Absent   Smooth Pursuits Intact   Saccades Intact     Vestibulo-Occular Reflex   Comment head impulse test positive as checked on 11/24/2015     Positional Testing   Sidelying Test Sidelying Right;Sidelying Left   Horizontal Canal Testing Horizontal Canal Right;Horizontal Canal Left                 [facility] Adult PT Treatment/Exercise - 12/17/15 1031      Ambulation/Gait   Ambulation/Gait Yes   Gait Comments Patient walks at slow speed when performing horizontal/vertical head motion and reaches for walls at times.  He also compensates by using a wider base of support in stance.  Also worked on 360 degree turns with rest in between with increase in symptoms of lightheadedness and intermittent nausea.      Self-Care   Other Self-Care Comments  On 3rd month of [name].    The patient is not bothered during exercise program and is lifting 50-70 lbs more than when he began.  However, he still notes that overall, his functional mobility  is not improving.   The patient and PT discussed that he needs to be further evaluated by MD if recommended intervention is not creating functional gains.  Also note that at today's session, patient feels activity that most replicates symptoms is gaze (VOR) adaptation exercises and dynamic gait with head motion.       Neuro Re-ed    Neuro Re-ed Details  Standing corner balance with head motion 10 reps horizontal without c/o dizziness and up/down head motion.  Modified VOR to be seated versus standing as patient not performing at home due to intolerance to standing VOR exercises.          Vestibular Treatment/Exercise - 12/17/15 1029      Vestibular Treatment/Exercise   Vestibular Treatment Provided Gaze     X1 Viewing Horizontal   Foot Position seated   Comments  able to do 30 seconds at slow pace with mild to moderate symptoms provoked.            PT Education - 12/17/15 1308    Education provided Yes   Education Details updated HEP: dynamic gait with horizontal/vertical head motion; seated gaze x 1   Person(s) Educated Patient   Methods Explanation;Demonstration;Tactile cues;Handout   Comprehension Verbalized understanding;Returned demonstration          PT Short Term Goals - 11/10/15 1046      PT SHORT TERM GOAL #1   Title The patient will return demo ability to take heart rate for exercise monitoring.   Baseline Met 8/16.   Time 4   Period Weeks   Status Achieved     PT SHORT TERM GOAL #2   Title The patient will return demo HEP for LE strengthening.   Baseline Pt doing strengthening in gym   Time 4   Period Weeks   Status Achieved     PT SHORT TERM GOAL #3   Title The patient will tolerate cardio workout at base pace x 30 minutes 3 days/week.   Baseline Met 8/16.   Time 4   Period Weeks   Status Achieved     PT SHORT TERM GOAL #4   Title The patient will verbalize understanding of progression of training program to improve endurance and tolerance to activities.   Baseline Met on 10/20/2015 with patient able to verbalize program and transition to gym for cardio routine.     Time 4   Period Weeks   Status Achieved           PT Long Term Goals - 12/17/15 1313      PT LONG TERM GOAL #1   Title The patient will improve activities of balance confidence score from 31.3% to > or equal to 45% to demo improved self perception of mobility.   Baseline Modified target date 01/17/2016   Time 4   Period Weeks   Status Revised     PT LONG TERM GOAL #2   Title The patient will ambulate on treadmill to demo tolerance to upright exercise x 10 minutes in PT clinic to demo progression through exercise protocol for dysautonomia.   Baseline Per subjective report met on 12/17/2015   Time 8   Period Weeks   Status Achieved     PT  LONG TERM GOAL #3   Title The patient will subjectively verbalize ability to self progress through post d/c exercise program.   Baseline The patient is independent with home program.  PT recommends f/u with MD to ensure current intervention is  still recommended.    Time 8   Period Weeks   Status Achieved     PT LONG TERM GOAL #4   Title The patient will improve hamstring strength from 4/5 bilateral knee flexion to 5/5.   Baseline Not formally tested--patient has increased to lifting heavy weights in gym, however he still reports functional "weakness" when feeling lightheaded and fatigued.    Time 8   Period Weeks   Status Deferred     PT LONG TERM GOAL #5   Title The patient will return demo HEP for gaze adaptation and dynamic balance to address motion sensitivity for post d/c home program.   Baseline Target date 01/17/2016   Time 4   Period Weeks   Status New               Plan - 12/17/15 1050    Clinical Impression Statement The patient has met STGs and partially  met LTGs in physical therapy.  With the interventions provided of LE strengthening program for home, [name] for progression of cardio exercise + LE exercise, the patient is reporting only minimal changes in functional abilities.  He reports being most hindered by fatigue and a generalized weakness/lightheaded sensation.  He has been compliant with exercise recommendations and has joined a gym where he logged over 1530 hours of cardio training in September (per log he e-mailed PT from "[employer]").  PT noted patient experienced sensations of "lightheadedness" with head motion in standing and has added gaze adaptation exercises (based on positive head impulse test bilaterally for low amplitude refixation saccade).  PT recommends follow-up with MD before further visits as the intervention provided is not yielding a significant improvement in his subjective reports and abilities to return to prior life roles and job  duties.  Patient to f/u with PT after medical work-up scheduled 10/17 at [facility].    PT Duration --  1 visit every 3 weeks   PT Treatment/Interventions ADLs/Self Care Home Management;Therapeutic exercise;Therapeutic activities;Gait training;Patient/family education;Balance training;Neuromuscular re-education;Functional mobility training;Stair training   PT Next Visit Plan Check new additions to HEP, f/u regarding MD work-up, and modify interventions as indicated.  Anticipate d/c in next 4 weeks from PT as patient able to tolerate home activities well.   Consulted and Agree with Plan of Care Patient      Patient will benefit from skilled therapeutic intervention in order to improve the following deficits and impairments:  Abnormal gait, Decreased activity tolerance, Decreased strength, Postural dysfunction, Difficulty walking, Decreased endurance  Visit Diagnosis: Other abnormalities of gait and mobility  Muscle weakness (generalized)  Unsteadiness on feet     Problem List Patient Active Problem List   Diagnosis Date Noted  . Aneurysm, ascending aorta ([place]) 05/19/2014  . Near syncope 05/18/2014  . Syncope 05/18/2014    Bryna Razavi, PT 12/18/2015, 7:07 AM  [facility] [address] [place], [place], [address] Phone: [phone]   Fax:  [phone]  Name: Ksean Vale MRN: [id number] Date of Birth: 05-27-1965

## 2016-01-07 ENCOUNTER — Ambulatory Visit: Payer: BLUE CROSS/BLUE SHIELD | Admitting: Rehabilitative and Restorative Service Providers"

## 2016-01-07 ENCOUNTER — Encounter: Payer: Self-pay | Admitting: Rehabilitative and Restorative Service Providers"

## 2016-01-07 VITALS — BP 149/79 | HR 87

## 2016-01-07 DIAGNOSIS — R2681 Unsteadiness on feet: Secondary | ICD-10-CM

## 2016-01-07 DIAGNOSIS — M6281 Muscle weakness (generalized): Secondary | ICD-10-CM

## 2016-01-07 DIAGNOSIS — R2689 Other abnormalities of gait and mobility: Secondary | ICD-10-CM | POA: Diagnosis not present

## 2016-01-07 NOTE — Therapy (Signed)
[facility] [address] [place], [place], [address] Phone: [phone]   Fax:  [phone]  Patient Details  Name: Jason Osborn MRN: [id number] Date of Birth: [birth year] Referring Provider:  No ref. provider found  Encounter Date: 01/07/2016  PHYSICAL THERAPY DISCHARGE SUMMARY  Visits from Start of Care: 12  Current functional level related to goals / functional outcomes:     PT Short Term Goals - 11/10/15 1046      PT SHORT TERM GOAL #1   Title The patient will return demo ability to take heart rate for exercise monitoring.   Baseline Met 8/16.   Time 4   Period Weeks   Status Achieved     PT SHORT TERM GOAL #2   Title The patient will return demo HEP for LE strengthening.   Baseline Pt doing strengthening in gym   Time 4   Period Weeks   Status Achieved     PT SHORT TERM GOAL #3   Title The patient will tolerate cardio workout at base pace x 30 minutes 3 days/week.   Baseline Met 8/16.   Time 4   Period Weeks   Status Achieved     PT SHORT TERM GOAL #4   Title The patient will verbalize understanding of progression of training program to improve endurance and tolerance to activities.   Baseline Met on 10/20/2015 with patient able to verbalize program and transition to gym for cardio routine.     Time 4   Period Weeks   Status Achieved         PT Long Term Goals - 01/07/16 1054      PT LONG TERM GOAL #1   Title The patient will improve activities of balance confidence score from 31.3% to > or equal to 45% to demo improved self perception of mobility.   Baseline scored 33.8% at discharge.  No significant improvement.   Time 4   Period Weeks   Status Not Met     PT LONG TERM GOAL #2   Title The patient will ambulate on treadmill to demo tolerance to upright exercise x 10 minutes in PT clinic to demo progression through exercise protocol for dysautonomia.   Baseline Per subjective report met on  12/17/2015   Time 8   Period Weeks   Status Achieved     PT LONG TERM GOAL #3   Title The patient will subjectively verbalize ability to self progress through post d/c exercise program.   Baseline The patient is independent with home program.  PT recommends f/u with MD to ensure current intervention is still recommended.    Time 8   Period Weeks   Status Achieved     PT LONG TERM GOAL #4   Title The patient will improve hamstring strength from 4/5 bilateral knee flexion to 5/5.   Baseline Not formally tested--patient has increased to lifting heavy weights in gym, however he still reports functional "weakness" when feeling lightheaded and fatigued.    Time 8   Period Weeks   Status Deferred     PT LONG TERM GOAL #5   Title The patient will return demo HEP for gaze adaptation and dynamic balance to address motion sensitivity for post d/c home program.   Baseline Target date 01/17/2016   Time 4   Period Weeks   Status Achieved        Remaining deficits: Continued intermittent/variable episodes of weakness, nausea with movement.   Education /  Equipment: HEP, levine protocol, community exercise.  Plan: Patient agrees to discharge.  Patient goals were not met. Patient is being discharged due to meeting the stated rehab goals.  ?????        Thank you for the referral of this patient. Rudell Cobb, MPT   [name] 01/07/2016, 1:21 PM  [facility] [address] [place], [place], [address] Phone: [phone]   Fax:  [phone]

## 2016-01-07 NOTE — Therapy (Signed)
[facility] [address] [place], [place], [address] Phone: [phone]   Fax:  [phone]  Physical Therapy Treatment  Patient Details  Name: Jason Osborn MRN: [id number] Date of Birth: 1965-04-11 Referring Provider: Donnamae Jude, MD  Encounter Date: 01/07/2016      PT End of Session - 01/07/16 1053    Visit Number 12   Number of Visits 16   Date for PT Re-Evaluation 01/17/16   Authorization Type private insurance   PT Start Time 1018   PT Stop Time 1058   PT Time Calculation (min) 40 min   Activity Tolerance Patient limited by fatigue   Behavior During Therapy [facility] for tasks assessed/performed      Past Medical History:  Diagnosis Date  . Hypercholesteremia     Past Surgical History:  Procedure Laterality Date  . KNEE ARTHROSCOPY      Vitals:   01/07/16 1024 01/07/16 1049 01/07/16 1050  BP: 134/80 (!) 156/84 (!) 149/79  Pulse: 77  87        Subjective Assessment - 01/07/16 1023    Subjective The patient was seen at [facility] 12/28/2015 and reports evaluation by NP for cardiology.  He notes today having lightheadedness, fatigue, and sweaty/clammy sensation.  His vitals are WNLs.  They recommended patient continue the [facility] protocol.   The patient reports that he has variable abilities to perform gaze exercises (good and bad days with no predictive qualities).   He reports having 3 days last week without desire to do anything (unable to work out).  He is being referred to neurology, thoracic surgeon (to further evaluate thoracic aneurysm).   The patient reports his [facility] visit is on 01/18/2016.     Pertinent History Likes to be called "Jason Osborn".   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    Patient Stated Goals Gain strength in legs.     Currently in Pain? No/denies  has been getting HA more frequently -- usually at night and happening every 2 days      SELF CARE/HOME MANAGEMENT: PT and patient discussed  HEP continuation.  Patient has progressed [name] to perform up to 60 minutes of continuous cardio exercise. Patient reported it is challenging.  We discussed that if this is creating greater fatigue, I would drop to to general recommendations of 45 minutes 5x/week of cardio exercise as at this point we would expect changes in functional performance and subjective reports with [name] protocol. The [name] does not yet seem to be impacting his overall return to prior level of functioning.  The patient is scheduled to undergo further neurologic workup, syncopal clinic workup, vestibular workup.  We discussed continuing gaze exercises regularly as this is a provoking exercise (varies where he has nausea on "bad" days and can tolerate well on "good" days).    Discussed continuing strengthening program.  He reports lightheaded sensation provoked every time after leg press (doing 205 lbs).    THERAPEUTIC EXERCISE: Leg press x 12 reps @ 205 lbs with BP documented in patient vitals. *Patient requested we monitor vitals after exercises. Discussed modifying weight to lower weight as needed.        PT Short Term Goals - 11/10/15 1046      PT SHORT TERM GOAL #1   Title The patient will return demo ability to take heart rate for exercise monitoring.   Baseline Met 8/16.   Time 4   Period Weeks   Status Achieved  PT SHORT TERM GOAL #2   Title The patient will return demo HEP for LE strengthening.   Baseline Pt doing strengthening in gym   Time 4   Period Weeks   Status Achieved     PT SHORT TERM GOAL #3   Title The patient will tolerate cardio workout at base pace x 30 minutes 3 days/week.   Baseline Met 8/16.   Time 4   Period Weeks   Status Achieved     PT SHORT TERM GOAL #4   Title The patient will verbalize understanding of progression of training program to improve endurance and tolerance to activities.   Baseline Met on 10/20/2015 with patient able to verbalize  program and transition to gym for cardio routine.     Time 4   Period Weeks   Status Achieved           PT Long Term Goals - 01/07/16 1054      PT LONG TERM GOAL #1   Title The patient will improve activities of balance confidence score from 31.3% to > or equal to 45% to demo improved self perception of mobility.   Baseline scored 33.8% at discharge.  No significant improvement.   Time 4   Period Weeks   Status Not Met     PT LONG TERM GOAL #2   Title The patient will ambulate on treadmill to demo tolerance to upright exercise x 10 minutes in PT clinic to demo progression through exercise protocol for dysautonomia.   Baseline Per subjective report met on 12/17/2015   Time 8   Period Weeks   Status Achieved     PT LONG TERM GOAL #3   Title The patient will subjectively verbalize ability to self progress through post d/c exercise program.   Baseline The patient is independent with home program.  PT recommends f/u with MD to ensure current intervention is still recommended.    Time 8   Period Weeks   Status Achieved     PT LONG TERM GOAL #4   Title The patient will improve hamstring strength from 4/5 bilateral knee flexion to 5/5.   Baseline Not formally tested--patient has increased to lifting heavy weights in gym, however he still reports functional "weakness" when feeling lightheaded and fatigued.    Time 8   Period Weeks   Status Deferred     PT LONG TERM GOAL #5   Title The patient will return demo HEP for gaze adaptation and dynamic balance to address motion sensitivity for post d/c home program.   Baseline Target date 01/17/2016   Time 4   Period Weeks   Status Achieved               Plan - 01/07/16 1037    Clinical Impression Statement The patient is progressing [name], performing gaze HEP, balance HEP.  PT recommended he continue current plan to tolerance as he undergoes further medical workup.  Discharge from PT with partial LTGs met.     PT  Treatment/Interventions ADLs/Self Care Home Management;Therapeutic exercise;Therapeutic activities;Gait training;Patient/family education;Balance training;Neuromuscular re-education;Functional mobility training;Stair training   PT Next Visit Plan Discharge today.   Consulted and Agree with Plan of Care Patient      Patient will benefit from skilled therapeutic intervention in order to improve the following deficits and impairments:  Abnormal gait, Decreased activity tolerance, Decreased strength, Postural dysfunction, Difficulty walking, Decreased endurance  Visit Diagnosis: Other abnormalities of gait and mobility  Muscle weakness (generalized)  Unsteadiness on feet     Problem List Patient Active Problem List   Diagnosis Date Noted  . Aneurysm, ascending aorta ([place]) 05/19/2014  . Near syncope 05/18/2014  . Syncope 05/18/2014    Alfrieda Tarry , PT 01/07/2016, 1:11 PM  [facility] [address] [place], [place], [address] Phone: [phone]   Fax:  [phone]  Name: Jason Osborn MRN: [id number] Date of Birth: Jul 13, 1965

## 2016-02-11 ENCOUNTER — Telehealth: Payer: Self-pay | Admitting: Rehabilitative and Restorative Service Providers"

## 2016-02-11 NOTE — Telephone Encounter (Signed)
PT clinic received [facility] from [facility] for disability for Mr. Jason Osborn.  Left message for Jason KaufmannMelissa that patient has been discharged from PT and no further updates available.  Recommended further f/u with patient's MD.  Margretta DittyWEAVER,Jason Osborn, PT [facility] [address] [place], [place]  [address] Phone:  [phone] Fax:  [phone]

## 2016-06-26 DIAGNOSIS — M5000 Cervical disc disorder with myelopathy, unspecified cervical region: Secondary | ICD-10-CM | POA: Insufficient documentation

## 2016-07-20 DIAGNOSIS — Q231 Congenital insufficiency of aortic valve: Secondary | ICD-10-CM | POA: Insufficient documentation

## 2016-07-20 DIAGNOSIS — Q2381 Bicuspid aortic valve: Secondary | ICD-10-CM | POA: Insufficient documentation

## 2017-03-13 HISTORY — PX: THORACIC AORTIC ANEURYSM REPAIR: SHX799

## 2017-04-17 DIAGNOSIS — R0789 Other chest pain: Secondary | ICD-10-CM | POA: Insufficient documentation

## 2017-05-30 DIAGNOSIS — G4710 Hypersomnia, unspecified: Secondary | ICD-10-CM | POA: Insufficient documentation

## 2017-05-31 DIAGNOSIS — G629 Polyneuropathy, unspecified: Secondary | ICD-10-CM | POA: Insufficient documentation

## 2017-12-21 DIAGNOSIS — G8918 Other acute postprocedural pain: Secondary | ICD-10-CM | POA: Insufficient documentation

## 2017-12-21 DIAGNOSIS — I4891 Unspecified atrial fibrillation: Secondary | ICD-10-CM | POA: Insufficient documentation

## 2017-12-21 DIAGNOSIS — Z9889 Other specified postprocedural states: Secondary | ICD-10-CM | POA: Insufficient documentation

## 2018-02-04 ENCOUNTER — Telehealth: Payer: Self-pay

## 2018-02-04 NOTE — Telephone Encounter (Signed)
SENT REFERRAL TO SCHEDULING NO NOTES 

## 2018-04-02 DIAGNOSIS — I10 Essential (primary) hypertension: Secondary | ICD-10-CM | POA: Insufficient documentation

## 2018-04-18 DIAGNOSIS — G909 Disorder of the autonomic nervous system, unspecified: Secondary | ICD-10-CM | POA: Insufficient documentation

## 2018-04-18 DIAGNOSIS — R058 Other specified cough: Secondary | ICD-10-CM | POA: Insufficient documentation

## 2018-04-18 DIAGNOSIS — T464X5A Adverse effect of angiotensin-converting-enzyme inhibitors, initial encounter: Secondary | ICD-10-CM | POA: Insufficient documentation

## 2018-05-30 ENCOUNTER — Encounter: Payer: Self-pay | Admitting: Internal Medicine

## 2018-06-03 ENCOUNTER — Telehealth: Payer: Self-pay | Admitting: Internal Medicine

## 2018-06-03 NOTE — Telephone Encounter (Signed)
Called pt   HE has several cardiac issues   Followed at [facility] have dysautonomia and wanted to see me because of this With corona virus pandemic deferring nonemergent issues    Would plan for appt later in summer when infection risk subsides July/Aug

## 2018-06-04 NOTE — Telephone Encounter (Signed)
Canceled pts 3/27 appt Pt aware Dr. Tenny Craw will see pt July/Aug Will route to cancel pool

## 2018-06-07 ENCOUNTER — Ambulatory Visit: Payer: BLUE CROSS/BLUE SHIELD | Admitting: Internal Medicine

## 2018-06-20 ENCOUNTER — Telehealth: Payer: Self-pay

## 2018-06-20 NOTE — Telephone Encounter (Signed)
LMTCB so that pt could be rescheduled for OV with Dr. Tenny Craw in August.

## 2018-06-20 NOTE — Telephone Encounter (Signed)
Follow up    Patient returned call and is scheduled for August. No call back needed

## 2018-10-11 ENCOUNTER — Telehealth: Payer: Self-pay

## 2018-10-11 NOTE — Telephone Encounter (Signed)

## 2018-10-12 NOTE — Progress Notes (Signed)
Cardiology Office Note   Date:  10/14/2018   ID:  Jason Osborn, DOB Nov 28, 1965, MRN [id number]  PCP:  Curly Rim, MD  Cardiologist:   Dorris Carnes, MD   Pt is self referred for evaluation of dizziness    History of Present Illness: Jason Osborn is a 53 y.o. male with a history of bicuspid aortic valve, thoracic aortic aneurysm.  He is s/p repair at [facility] in October 2019 Post op had atrial fibrillation  On amiodarone for a short time   Th pt had an episode of dizziness and syncope since 3/16  Sitting in church had syncope  Lasted seconds.    Tilt table with heart block  Underwent PPM in Sept 2016   He underwent lead revision in October 2019.   Since then he has continue to have intermitt dziiness   He has been seen at multiple institutions       The patient was seen by Dr Truett Mainland in November 2019 with tachycardia and dizziness.  He had been placed on aldactone and this was stopped and he was placed on lisinopril The pt was seen iat [facility] by Delane Ginger in Jan 2020   HR was increased   Device interrogation showed sinus tachycardia with PAT    Pt continued to have near syncopal spells    Seen in [facility] ED in March 2020 afer a syncopal spell   He was at [employer], kneeling at time    Suddenly he says he felt  warm and diaphoretic then dizzy.  Passed out.   No palpitations.  FIrst full syncope    EMS arrived  Was orthostatic   Giveen IV fluids in ED and sent home    A few days later(March 2020) he was getting ready for work at about 11 AM  Had eatten  Had just gotten t out of shower   Again felt warm, dizzy.    No complete LOC Orthoatstaics Showed BP 108.  SBP decreased  to 98 with standing   HR increased by 9 points  Cardiology saw pt ( M Skains)  REcomm hydration   Felt OK for d/c  Since March he says he has continued to have spells   Feels drained.   Occur 1 to 2 x per week   Funny thing is after a couple beers he is OK    Initially I was set to see the patient in March but, due to Covid,  this was delayed   The pateint was seen back  By K Corrigatn in June       The pt was seen in past at [facility] in September 2017    He was also seen at [facility] orthostatic clinic   Note th patient has a hx of small fiber neuropathy.  Has some evid of demyelinating illness    Last [facility] visit for dizziness/ syncope was in 10/2017  Orthostatics were negative    Also neg in Feb 2019(BP high at time) and 12/2015 Syncope felt vasovagal  REcomm fluids    Noted on this visit and previous some vertigo  Also some dizziness with change in head position  Current Meds  Medication Sig  . amLODipine (NORVASC) 5 MG tablet Take 5 mg by mouth daily.  Marland Kitchen aspirin EC 81 MG tablet Take 81 mg by mouth daily.  . DULoxetine (CYMBALTA) 60 MG capsule Take 60 mg by mouth daily.  Marland Kitchen gabapentin (NEURONTIN) 600 MG tablet Take 600 mg by mouth  3 (three) times daily.  Marland Kitchen. pyridostigmine (MESTINON) 60 MG tablet Take 15 mg by mouth 3 (three) times daily.  . rosuvastatin (CRESTOR) 10 MG tablet Take 20 mg by mouth daily.      Allergies:   Amantadine, Atenolol, Atorvastatin, Pyridostigmine, Simvastatin, and Amantadines   Past Medical History:  Diagnosis Date  . Aneurysm, ascending aorta (HCC) 05/19/2014  . Atrial fibrillation (HCC)    POST OPERATIVE  . Atypical chest pain    NEGATIVE ADENOSINE CMR 05/23/17, NORMAL RIGHT AND LEFT HEART CATH AT [facility] ON 12/19/17  . Bicuspid aortic valve   . Cardiac pacemaker in situ   . Dizziness   . Elevated liver function tests   . Essential hypertension   . Fatty liver   . Hypercholesteremia   . Hyperlipidemia   . Hypertension   . Left elbow pain   . Near syncope 05/18/2014  . Neuropathy   . Syncope 05/18/2014   CARDIOINHIBITOR SYNCOPE S/P PPM S/P DISRUPTION OF ATRIAL LEAD FROM ABOVE SURGICAL PROCEDURE S/P LASER LEAD EXTRACTION/VISION 10/11 AT [facility]  . Tendinopathy of elbow   . Thoracic aortic aneurysm (HCC)    S/P 28MM ASCENDING HEMIARCH WITH AV REPAIR AT [facility] ON 12/20/17    Past  Surgical History:  Procedure Laterality Date  . KNEE ARTHROSCOPY  2004, 1986  . PACEMAKER INSERTION       Social History:  The patient  reports that he has never smoked. He has never used smokeless tobacco. He reports current alcohol use. He reports that he does not use drugs.   Family History:  The patient's family history is not on file.    ROS:  Please see the history of present illness. All other systems are reviewed and  Negative to the above problem except as noted.    PHYSICAL EXAM: VS:  BP 132/77   Pulse 72   Ht 6\' 1"  (1.854 m)   Wt 227 lb 3.2 oz (103.1 kg)   BMI 29.98 kg/m   Orthostatic:   Laying 122/75  P 72   Sitting   126/77  P 70   Standing 132 / 77   P 73   Standing 4 min 132/82  P 74     GEN: Well nourished, well developed, in no acute distress  HEENT: normal  Neck: no JVD, carotid bruits, or masses Cardiac: RRR; no murmurs, rubs, or gallops,no edema  Respiratory:  clear to auscultation bilaterally, normal work of breathing GI: soft, nontender, nondistended, + BS  No hepatomegaly  MS: no deformity Moving all extremities   Skin: warm and dry, no rash Neuro:  Strength and sensation are intact Psych: euthymic mood, full affect   EKG:  EKG is not  ordered today.  REmote check of pacer:   No aarrhythmias detected     Lipid Panel No results found for: CHOL, TRIG, HDL, CHOLHDL, VLDL, LDLCALC, LDLDIRECT    Wt Readings from Last 3 Encounters:  10/14/18 227 lb 3.2 oz (103.1 kg)  05/19/14 192 lb (87.1 kg)  05/17/14 195 lb (88.5 kg)    Cardiac MRI 10/11/18    1. The left ventricle is normal in cavity size. There is mild concentric LV hypertrophy. Global systolic  function is hyperdynamic with an LV ejection fraction calculated at 76%. There are no regional wall motion  abnormalities.    2. The right ventricle is normal in cavity size, wall thickness, and systolic function. A RV lead is seen  ending at the apical septum.  3. Both atria are  normal in size. An RA lead is seen.    4. The aortic valve is bicuspid in morphology. The patient is status post aortic valve repair (raphe  shaving/debridement of conjoint cusp). There is no significant aortic stenosis. The peak aortic valve area  by planimetry is 4.1 cm2. Peak flow velocity at the aortic valve is 1.9 m/s. There is a central jet of mild  aortic regurgitation. No other significant valvular disease seen.    5. Delayed enhancement MRI for viability is normal. There is no evidence of myocardial infarction,  scarring, or infiltration.    6. No LV thrombus seen.      Thoracic MRA:    1. The patient is status post ascending aortic and hemiarch replacement. The aortic root is top-normal in  size. There is no extravasation of contrast or aortic dissection seen. The tubular ascending aorta, aortic  arch and descending thoracic aorta are normal in diameter.     Bi-orthogonal luminal aortic dimensions are listed below:    Sinuses of Valsalva: 4.0 x 3.5 cm  Sinotubular junction: 2.8 x 2.7 cm  Asc. aorta (at level of PA bifurcation): 3.0 x 3.0 cm   Prox. arch (prior to the right innominate artery): 2.9 x 2.9 cm  Transverse arch: 2.6 x 2.5 cm   Descending thoracic aorta (PA): 2.6 x 2.4 cm (2.4 x 2.3 cm)  Descending aorta (diaphragm): 2.4 x 2.3 cm (2.3 x 2.2 cm)       2. The aortic arch is left sided. There is normal branching of the arch vessels.     3. The main and proximal branch pulmonary arteries are normal in size.     4. There are normal systemic and pulmonary venous connections.     Tilt table 08/19/15  Vasovagal syncope 08/19/2015  10/09/2014 Tilt Table ([facility]): The patient was monitored at baseline in the supine position and then tilted to a 70 degree angle with continuous monitoring. After being returned to the supine position the patient was observed and monitored. After six minutes, the patient began to have intermittent  heart block for a few beats, followed by 8 seconds of complete heart block with no escape. He also lost consciousness with that, and the table was lowered to supine, at which NSR with normal conduction resumed. There were no apparent complications. Post Procedure Rhythm:Sinus rhythm Interpretation: Vasovagal syncope with a strong cardioinhibitory component - 8 seconds of complete heart block. The image has been saved to a "documentation only" encounter today, as I can't figure out how to insert the picture into this note. I discussed the finding with him and his wife.Although no treatment is guaranteed to completely resolve syncope, a PPM implant would be very likely to at least provid     ASSESSMENT AND PLAN:  1  Dizziness/syncope   Pt has had a LONG hx of symptoms   He has a PPM placed for heart block demonstrated on tilt table    Still with symptoms   [facility], [facility]   Will review outside records   Could consider stopping amlodipine which vasodilates   Consider nonselective b blocker Will review with EP service Recomm  Fluids  Watch for symptoms to avoid syncope and injury.  2  Hx bicuspid AV with  Aneurysm  Pt s/p AVR with aortic replacement  3  Hx small fiber neuropathy.  4  Hx HTN  Difficult  with above    Current medicines are reviewed at length with the patient today.  The patient does not have concerns regarding medicines.  Signed, Dietrich PatesPaula Houston Zapien, MD  10/14/2018 3:15 PM    [facility] [address], [place], [place]  [address] Phone: [phone]; Fax: [phone]

## 2018-10-14 ENCOUNTER — Ambulatory Visit: Payer: BC Managed Care – PPO | Admitting: Internal Medicine

## 2018-10-14 ENCOUNTER — Encounter: Payer: Self-pay | Admitting: Internal Medicine

## 2018-10-14 ENCOUNTER — Other Ambulatory Visit: Payer: Self-pay

## 2018-10-14 VITALS — BP 132/77 | HR 72 | Ht 73.0 in | Wt 227.2 lb

## 2018-10-14 DIAGNOSIS — R55 Syncope and collapse: Secondary | ICD-10-CM

## 2018-10-14 DIAGNOSIS — G629 Polyneuropathy, unspecified: Secondary | ICD-10-CM

## 2018-10-14 DIAGNOSIS — I359 Nonrheumatic aortic valve disorder, unspecified: Secondary | ICD-10-CM | POA: Diagnosis not present

## 2018-10-14 DIAGNOSIS — I10 Essential (primary) hypertension: Secondary | ICD-10-CM | POA: Diagnosis not present

## 2018-10-14 NOTE — Patient Instructions (Signed)
Medication Instructions:  Your physician recommends that you continue on your current medications as directed. Please refer to the Current Medication list given to you today.  If you need a refill on your cardiac medications before your next appointment, please call your pharmacy.   Lab work: None If you have labs (blood work) drawn today and your tests are completely normal, you will receive your results only by: Marland Kitchen MyChart Message (if you have MyChart) OR . A paper copy in the mail If you have any lab test that is abnormal or we need to change your treatment, we will call you to review the results.  Testing/Procedures: None  Follow-Up: Dr. Harrington Challenger will review the chart and decide on next steps and we will call you to go over everything.  Any Other Special Instructions Will Be Listed Below (If Applicable).

## 2018-11-07 ENCOUNTER — Telehealth: Payer: Self-pay | Admitting: Internal Medicine

## 2018-11-07 NOTE — Telephone Encounter (Signed)
Called patient    I have reviewed records from outside institutions ([facility], [facility])  Pt had been to [facility] though no records to view I also reviewed with Olin Pia Complicated case  WIth hx Olin Pia recomm pt consider going to [facility] to see Vivi Barrack, MD  On talking to the pt today he says he has some good days  Some days he cant do anything   No syncope New to him is he has felt his heart feel like it stops then starts over the past few days  Not sure what this is   New  ALSO--  The patient says he thinks I was Vivi Barrack he saw at [facility]   Per his report, Dr Adonis Huguenin said he had something going on but was not definitive  Plan  Will review with Olin Pia   I question whether an appt for full pacer interrogation would be important at this point Also -   Will send pt a release of information to get recrods from [facility] [place] (seen in 2017)

## 2018-11-08 NOTE — Telephone Encounter (Signed)
Message sent to medical records department to get ROI signed and request records.

## 2018-11-21 ENCOUNTER — Telehealth: Payer: Self-pay | Admitting: Internal Medicine

## 2018-11-21 NOTE — Telephone Encounter (Signed)
[facility] faxed request for medical records from the [facility] in [place] 11/20/18  [facility]

## 2018-12-31 NOTE — Telephone Encounter (Signed)
Echo report and ekg received and placed on Dr. Alan Ripper desk for review upon her return to the office.

## 2019-03-05 DIAGNOSIS — M792 Neuralgia and neuritis, unspecified: Secondary | ICD-10-CM | POA: Insufficient documentation

## 2019-03-05 DIAGNOSIS — M4714 Other spondylosis with myelopathy, thoracic region: Secondary | ICD-10-CM | POA: Insufficient documentation

## 2019-03-28 DIAGNOSIS — G629 Polyneuropathy, unspecified: Secondary | ICD-10-CM

## 2019-05-02 ENCOUNTER — Ambulatory Visit: Payer: BC Managed Care – PPO | Admitting: Neurology

## 2019-06-09 ENCOUNTER — Other Ambulatory Visit: Payer: Self-pay

## 2019-06-09 ENCOUNTER — Encounter: Payer: Self-pay | Admitting: Neurology

## 2019-06-09 ENCOUNTER — Ambulatory Visit: Payer: BC Managed Care – PPO | Admitting: Neurology

## 2019-06-09 VITALS — BP 151/83 | HR 73 | Resp 20 | Ht 73.0 in | Wt 230.0 lb

## 2019-06-09 DIAGNOSIS — G629 Polyneuropathy, unspecified: Secondary | ICD-10-CM

## 2019-06-09 DIAGNOSIS — M4714 Other spondylosis with myelopathy, thoracic region: Secondary | ICD-10-CM | POA: Diagnosis not present

## 2019-06-09 NOTE — Patient Instructions (Signed)
Recommend that you try to cut back on your alcohol consumption, as it is known to cause neurological disorders.  Continue to follow-up at [facility]

## 2019-06-09 NOTE — Progress Notes (Signed)
[facility] Neurology Division Clinic Note - Initial Visit   Date: 06/09/19  Jason Osborn MRN: [id number] DOB: [birth year]   Dear Dr. Tenny Craw:  Thank you for your kind referral of Jason Osborn for consultation of small fiber neuropathy. Although his history is well known to you, please allow [place] to reiterate it for the purpose of our medical record. The patient was accompanied to the clinic by self.   History of Present Illness: Jason Osborn is a 54 y.o. right-handed male with bicuspid aortic valve, bradycardia s/p PPM, hyperlipidemia, OSA, thoracic ascending aortic aneurysm s/p repair, myelopathy presenting for evaluation of small fiber neuropathy .  His initial symptoms recurrent syncope followed by leg heaviness and fatigue, gait changes, and paresthesias.  He was evaluated by Dr. Baldo Daub at [facility] neurology who diagnosed him with severe obstructive sleep apnea, mild small fiber neuropathy, and thoracic myelopathy.  He was evaluated by Dr. Ronalee Red 2019-2020 for possible underlying neuroimmunologic diagnosis.  He reports ongoing issues with burning pain in the feet, light headedness/dizziness, imbalance, cognitive changes, and leg fatigue. Prior testing has been extensive and included tilt table, NCS/EMG, MRI neuroaxis, serology testing, CSF testing, and skin biopsy.  Skin biopsy was normal, but sweat gland analysis showed low nerve fiber density consistent with mild small fiber neuropathy.  He has been evaluated at a number of medical centers for his neurological symptoms, most recently at [facility] neurology (Dr. Ronalee Red Neuroimmunology, Dr. Baird Cancer) for small fiber neuropathy and thoracic myelopathy.  At some point there was discussion whether a trial of rituximab was warranted. He has also been to [facility], and [place] neurology for their opinion.  His last clinic visit with Dr. Ronalee Red dated 01/24/2019 was reviewed and indicates there was no clear evidence of multiple sclerosis and  patient was managed symptomatically.  Due to refractory pain, he was referred to pain management.  Prior notes also indicate plans to refer him to [facility], but instead decided to seek an opinion at [facility], which is scheduled in May 2020.    He previously working in Insurance account manager in 2017.  He is working on getting on disability.   He lives in [place], [place].    He drinks 2-3 beers daily x 5 years. He is aware that alcohol can cause neuropathy, but says that it is the only thing that allows him to function.  It helps with leg fatigue.     Out-side paper records, electronic medical record, and images have been reviewed where available and summarized as:   MRI cervical and thoracic spine without contrast 01/31/2019: No significant interval change in the size and number of several small demyelinating lesions within the cervical and thoracic spine.  CSF 12/2016: W1 R122 P59* G66, no OCB   MRI brain 07/2016: Unchanged appearance of multiple T2 hyperintense foci including periventricular, callosal and subcortical lesions with larger focus seen within the subcortical right frontal lobe anteriorly. These findings are nonspecific and may reflect chronic white matter microvascular disease, although presence of callosal foci suggests possibility of demyelinating process. Of note, lack of IV contrast limits assessment, especially for active demyelination.  MRI/MRA head 06/2014 (care-everywhere): negative   MRI C spine 04/2016  1. An eccentric left disc bulge at C5-6 results in focal impression on the left ventral spinal cord without overall spinal canal stenosis. 2. Severe right and mild left neuroforaminal narrowing at C5-6 on the basis of uncovertebral hypertrophy.  Tilt table 09/2014 (care-everywhere): "After six minutes, the patient began to  have intermittent heart block for a few beats, followed by 8 seconds of complete heart block with no escape. He also lost  consciousness with that, and the table was lowered to  supine, at which NSR with normal conduction resumed."  EMG/NCS 05/2016: nml  Skin biopsy 05/2016: Skin biopsy results from May 30, 2016 received. Skin biopsy normal but sweat gland analysis revealed low nerve fiber density distally. No evidence of vasculitis or amyloidosis. Overall consistent with a mild small fiber neuropathy.   Lab Results  Component Value Date   TSH 1.345 05/18/2014    Past Medical History:  Diagnosis Date  . Aneurysm, ascending aorta (HCC) 05/19/2014  . Atrial fibrillation (HCC)    POST OPERATIVE  . Atypical chest pain    NEGATIVE ADENOSINE CMR 05/23/17, NORMAL RIGHT AND LEFT HEART CATH AT [facility] ON 12/19/17  . Bicuspid aortic valve   . Cardiac pacemaker in situ   . Dizziness   . Elevated liver function tests   . Essential hypertension   . Fatty liver   . Hypercholesteremia   . Hyperlipidemia   . Hypertension   . Left elbow pain   . Near syncope 05/18/2014  . Neuropathy   . Syncope 05/18/2014   CARDIOINHIBITOR SYNCOPE S/P PPM S/P DISRUPTION OF ATRIAL LEAD FROM ABOVE SURGICAL PROCEDURE S/P LASER LEAD EXTRACTION/VISION 10/11 AT [facility]  . Tendinopathy of elbow   . Thoracic aortic aneurysm (HCC)    S/P ASCENDING HEMIARCH WITH AV REPAIR AT [facility] ON 12/20/17    Past Surgical History:  Procedure Laterality Date  . KNEE ARTHROSCOPY  2004, 1986  . PACEMAKER INSERTION       Medications:  Outpatient Encounter Medications as of 06/09/2019  Medication Sig  . amLODipine (NORVASC) 5 MG tablet Take 5 mg by mouth daily.  Marland Kitchen aspirin EC 81 MG tablet Take 81 mg by mouth daily.  . pregabalin (LYRICA) 75 MG capsule Take by mouth.  . rosuvastatin (CRESTOR) 10 MG tablet Take 20 mg by mouth daily.   Marland Kitchen venlafaxine XR (EFFEXOR-XR) 75 MG 24 hr capsule Take by mouth.  . DULoxetine (CYMBALTA) 60 MG capsule Take 60 mg by mouth daily.  Marland Kitchen gabapentin (NEURONTIN) 600 MG tablet Take 600 mg by mouth 3 (three) times daily.  Marland Kitchen  pyridostigmine (MESTINON) 60 MG tablet Take 15 mg by mouth 3 (three) times daily.   No facility-administered encounter medications on file as of 06/09/2019.    Allergies:  Allergies  Allergen Reactions  . Amantadine Other (See Comments)    Nausea, low blood pressure Nausea, low blood pressure   . Atenolol Other (See Comments)  . Atorvastatin Other (See Comments) and Rash    Frequent urination Other reaction(s): Other Cerner listed no reactions Body Aches Frequent urination Cerner listed no reactions Muscle aches Body Aches Other reaction(s): Other Cerner listed no reactions Body Aches Frequent urination Frequent urination Cerner listed no reactions Muscle aches Frequent urination Cerner listed no reactions Muscle aches   . Pyridostigmine Other (See Comments) and Rash    Body Aches *able to take in small doses  Body Aches Other reaction(s): Other (see comments) Extremity weakness   . Simvastatin Other (See Comments) and Rash    Body aches Cerner listed no reactions Body aches Increased urination Body aches Cerner listed no reactions Body aches Body aches Body aches Cerner listed no reactions Increased urination Body aches Cerner listed no reactions Increased urination   . Amantadines     Family History: Family History  Problem  Relation Age of Onset  . Healthy Mother     Social History: Social History   Tobacco Use  . Smoking status: Never Smoker  . Smokeless tobacco: Never Used  Substance Use Topics  . Alcohol use: Yes    Comment: weekend use, 2-3 drinks daily  . Drug use: No   Social History   Social History Narrative   Lives with wife, does yard work on the weekends.   Right hand   2 story home    Vital Signs:  BP (!) 151/83   Pulse 73   Resp 20   Ht 6\' 1"  (1.854 m)   Wt 230 lb (104.3 kg)   SpO2 95%   BMI 30.34 kg/m   Neurological Exam: MENTAL STATUS including orientation to time, place, person, recent and remote memory,  attention span and concentration, language, and fund of knowledge is normal.  Speech is not dysarthric.  CRANIAL NERVES: II:  No visual field defects.   III-IV-VI: Pupils equal round and reactive to light.  Normal conjugate, extra-ocular eye movements in all directions of gaze.  No nystagmus.  No ptosis.   V:  Normal facial sensation.    VII:  Normal facial symmetry and movements.   VIII:  Normal hearing and vestibular function.   IX-X:  Normal palatal movement.   XI:  Normal shoulder shrug and head rotation.   XII:  Normal tongue strength and range of motion, no deviation or fasciculation.  MOTOR:  No atrophy, fasciculations or abnormal movements.  Muscle bulk is preserved throughout.  No pronator drift.   Upper Extremity:  Right  Left  Deltoid  5/5   5/5   Biceps  5/5   5/5   Triceps  5/5   5/5   Infraspinatus 5/5  5/5  Medial pectoralis 5/5  5/5  Wrist extensors  5/5   5/5   Wrist flexors  5/5   5/5   Finger extensors  5/5   5/5   Finger flexors  5/5   5/5   Dorsal interossei  5/5   5/5   Abductor pollicis  5/5   5/5   Tone (Ashworth scale)  0  0   Lower Extremity:  Right  Left  Hip flexors  5/5   5/5   Hip extensors  5/5   5/5   Adductor 5/5  5/5  Abductor 5/5  5/5  Knee flexors  5/5   5/5   Knee extensors  5/5   5/5   Dorsiflexors  5/5   5/5   Plantarflexors  5/5   5/5   Toe extensors  5/5   5/5   Toe flexors  5/5   5/5   Tone (Ashworth scale)  0  0   MSRs:  Right        Left                  brachioradialis 2+  2+  biceps 2+  2+  triceps 2+  2+  patellar 3+  3+  ankle jerk 2+  2+  Hoffman no  no  plantar response down  down   SENSORY:  Absent pin prick throughout arms and legs diffusely, vibration absent at the ankles, temperature reduced throughout the arms and legs.  Romberg's sign absent.   COORDINATION/GAIT: Normal finger-to- nose-finger.  Intact rapid alternating movements bilaterally.  Gait narrow based and stable. Mild unsteadiness with tandem and  stressed gait, but able to perform.    IMPRESSION: Constellation  of chronic symptoms including dizziness, leg fatigue, myalgia, small fiber neuropathy, cognitive changes, and cervical/thoracic myelopathy.  He has been extensively evaluated by neurologists at [facility] and [facility] without unifying diagnosis and is scheduled to see neurology at [facility] in May. I explained to patient that he is best served at an academic center and should continue to follow-up there.  I have counseled him that he should consider cutting back on alcohol consumption, especially as this is known to be neurotoxic and potentially worsening his neuropathy.    Total time spent reviewing records, history-taking, exam, and documentation on date of encounter: 60 min  Thank you for allowing me to participate in patient's care.  If I can answer any additional questions, I would be pleased to do so.    Sincerely,    Donika K. Allena Katz, DO

## 2019-10-14 ENCOUNTER — Ambulatory Visit: Payer: BC Managed Care – PPO | Admitting: Rehabilitative and Restorative Service Providers"

## 2019-10-15 ENCOUNTER — Other Ambulatory Visit: Payer: Self-pay

## 2019-10-15 ENCOUNTER — Ambulatory Visit (INDEPENDENT_AMBULATORY_CARE_PROVIDER_SITE_OTHER): Payer: BC Managed Care – PPO | Admitting: Rehabilitative and Restorative Service Providers"

## 2019-10-15 ENCOUNTER — Encounter: Payer: Self-pay | Admitting: Rehabilitative and Restorative Service Providers"

## 2019-10-15 DIAGNOSIS — R29818 Other symptoms and signs involving the nervous system: Secondary | ICD-10-CM

## 2019-10-15 DIAGNOSIS — R2681 Unsteadiness on feet: Secondary | ICD-10-CM | POA: Diagnosis not present

## 2019-10-15 DIAGNOSIS — R42 Dizziness and giddiness: Secondary | ICD-10-CM | POA: Diagnosis not present

## 2019-10-15 DIAGNOSIS — R2689 Other abnormalities of gait and mobility: Secondary | ICD-10-CM

## 2019-10-15 NOTE — Patient Instructions (Signed)
Access Code: [email or web address] URL: [email or web address] Date: 10/15/2019 Prepared by: Margretta Ditty  Exercises Seated Gaze Stabilization with Head Rotation - 3 x daily - 7 x weekly - 3 sets - 15 reps

## 2019-10-15 NOTE — Therapy (Signed)
[facility] [address] [place], [place], [address] Phone: [phone]   Fax:  [phone]  Physical Therapy Evaluation  Patient Details  Name: Jason Osborn MRN: [id number] Date of Birth: 1965/11/21 Referring Provider (PT): Jefferey Picaarlos A Pardo-Villamizar, MD   Encounter Date: 10/15/2019   PT End of Session - 10/15/19 1135    Visit Number 1    Number of Visits 12    Date for PT Re-Evaluation 11/26/19    Authorization Type BCBS    PT Start Time 1019    PT Stop Time 1106    PT Time Calculation (min) 47 min           Past Medical History:  Diagnosis Date  . Aneurysm, ascending aorta (HCC) 05/19/2014  . Atrial fibrillation (HCC)    POST OPERATIVE  . Atypical chest pain    NEGATIVE ADENOSINE CMR 05/23/17, NORMAL RIGHT AND LEFT HEART CATH AT [facility] ON 12/19/17  . Bicuspid aortic valve   . Cardiac pacemaker in situ   . Dizziness   . Elevated liver function tests   . Essential hypertension   . Fatty liver   . Hypercholesteremia   . Hyperlipidemia   . Hypertension   . Left elbow pain   . Near syncope 05/18/2014  . Neuropathy   . Syncope 05/18/2014   CARDIOINHIBITOR SYNCOPE S/P PPM S/P DISRUPTION OF ATRIAL LEAD FROM ABOVE SURGICAL PROCEDURE S/P LASER LEAD EXTRACTION/VISION 10/11 AT [facility]  . Tendinopathy of elbow   . Thoracic aortic aneurysm (HCC)    S/P 28MM ASCENDING HEMIARCH WITH AV REPAIR AT [facility] ON 12/20/17    Past Surgical History:  Procedure Laterality Date  . KNEE ARTHROSCOPY  2004, 1986  . PACEMAKER INSERTION      There were no vitals filed for this visit.    Subjective Assessment - 10/15/19 1023    Subjective The patient is known to me from prior physical therapy at neuro rehab.  He has undergone aortic aneurysm repair, he has been worked up with  neurology and cardiology.  At this time, the referral is for possible demyelinating disease of the spinal cord.  MS has been ruled out and transverse myelitis has been  ruled out.  He ambulates without a device indoors and uses a rollator RW outdoors.  He has falls about 1x/month "sometimes i catch myself, sometimes I don't".  Falls do not have a pattern.  He has [facility] and did not notice a big improvement.    Patient Stated Goals Instability and fatigue are 2 largest complaints. His goals are improved stability, reducing fatigue.    Currently in Pain? Yes    Pain Score 8     Pain Location Foot    Pain Orientation Right;Left    Pain Onset More than a month ago    Pain Frequency Constant    Effect of Pain on Daily Activities Numb from head to toe, but has pain in his feet.              [facility] PT Assessment - 10/15/19 1031      Assessment   Medical Diagnosis demyelinating disease of spinal cord    Referring Provider (PT) Christiana Pellantarlos A Pardo-Villamizar, MD    Hand Dominance Right    Prior Therapy known to me from prior PT at neuro rehab, has had [facility] re[facility]      Precautions   Precautions Fall  Restrictions   Weight Bearing Restrictions No      Balance Screen   Has the patient fallen in the past 6 months Yes    How many times? 6- averages 1 fall per month    Has the patient had a decrease in activity level because of a fear of falling?  Yes   due to nature of condition   Is the patient reluctant to leave their home because of a fear of falling?  No      Home Environment   Living Environment Private residence    Living Arrangements Spouse/significant other;Children   2 boys (22, 4)   Type of Home House    Home Access Stairs to enter    Entrance Stairs-Number of Steps 2    Home Layout Two level    Home Equipment Walker - 4 wheels;Shower seat      Prior Function   Level of Independence Independent with basic ADLs;Independent with community mobility with device    Vocation Unemployed      Observation/Other Assessments   Focus on Therapeutic Outcomes (FOTO)  60% limitation       Observation/Other Assessments-Edema    Edema --   occasional feet swelling     Sensation   Light Touch --   generalized numbness    Additional Comments He has some pressure sensations, can discriminate sharp/dull, cannot feel R lateral lower leg, cannot discriminate hot/cold  with R leg, but can with his hands      Coordination   Gross Motor Movements are Fluid and Coordinated Yes    Fine Motor Movements are Fluid and Coordinated Yes   Rapid alternating movements, eye tracking WNLs     ROM / Strength   AROM / PROM / Strength AROM;Strength      AROM   Overall AROM  Within functional limits for tasks performed      Strength   Overall Strength Within functional limits for tasks performed      Ambulation/Gait   Ambulation/Gait Yes    Ambulation/Gait Assistance 6: Modified independent (Device/Increase time)   modified due to need to reach for support surface   Ambulation Distance (Feet) 100 Feet    Assistive device 4-wheeled walker;None    Gait Pattern --   varied gait with some lateral stepping   Ambulation Surface Level;Indoor    Gait velocity 2.68 ft/sec      Functional Gait  Assessment   Gait assessed  Yes    Gait Level Surface Walks 20 ft in less than 7 sec but greater than 5.5 sec, uses assistive device, slower speed, mild gait deviations, or deviates 6-10 in outside of the 12 in walkway width.    Change in Gait Speed Makes only minor adjustments to walking speed, or accomplishes a change in speed with significant gait deviations, deviates 10-15 in outside the 12 in walkway width, or changes speed but loses balance but is able to recover and continue walking.    Gait with Horizontal Head Turns Performs head turns with moderate changes in gait velocity, slows down, deviates 10-15 in outside 12 in walkway width but recovers, can continue to walk.    Gait with Vertical Head Turns Performs task with moderate change in gait velocity, slows down, deviates 10-15 in outside 12 in walkway  width but recovers, can continue to walk.    Gait and Pivot Turn Pivot turns safely within 3 sec and stops quickly with no loss of balance.    Step Over  Obstacle Is able to step over one shoe box (4.5 in total height) without changing gait speed. No evidence of imbalance.    Gait with Narrow Base of Support Ambulates 4-7 steps.    Gait with Eyes Closed Walks 20 ft, uses assistive device, slower speed, mild gait deviations, deviates 6-10 in outside 12 in walkway width. Ambulates 20 ft in less than 9 sec but greater than 7 sec.    Ambulating Backwards Walks 20 ft, no assistive devices, good speed, no evidence for imbalance, normal gait    Steps Alternating feet, must use rail.    Total Score 18    FGA comment: 18/30                  Vestibular Assessment - 10/15/19 1041      Vestibular Assessment   General Observation Patient notes VOR is challenging      Oculomotor Exam   Oculomotor Alignment Normal    Spontaneous Absent    Gaze-induced  Absent    Smooth Pursuits Intact    Saccades Intact      Vestibulo-Ocular Reflex   VOR 1 Head Only (x 1 viewing) slow VOR provokes a sensation like he could black out    Comment head impulse test positive bilaterally for refixation saccade              Objective measurements completed on examination: See above findings.        Vestibular Treatment/Exercise - 10/15/19 1146      Vestibular Treatment/Exercise   Vestibular Treatment Provided Gaze    Gaze Exercises X1 Viewing Horizontal      X1 Viewing Horizontal   Foot Position seated    Comments tolerates 30 seconds with dizziness 6/10.  Modified to 15 reps at home to begin.                 PT Education - 10/15/19 1135    Education Details HEP for VOR initiated    Person(s) Educated Patient    Methods Explanation;Demonstration;Handout    Comprehension Verbalized understanding;Returned demonstration               PT Long Term Goals - 10/15/19 1136      PT  LONG TERM GOAL #1   Title The patient will return demo HEP for conditioning, balance, gaze, and general mobility.    Time 6    Period Weeks    Target Date 11/26/19      PT LONG TERM GOAL #2   Title The patient will reduce functional limitations per FOTO improving from 60% limited to < or equal to 48% limited.    Time 6    Period Weeks    Target Date 11/26/19      PT LONG TERM GOAL #3   Title The patient will improve FGA from 18/30 to > or equal to 24/30 demonstrating improved dynamic stability.    Time 6    Period Weeks    Target Date 11/26/19      PT LONG TERM GOAL #4   Title The patient will demonstrate 20 minutes of standing functional activity (picking up objects, walking, stairs, standing ex) to demo improved activity tolerance.    Time 6    Period Weeks    Target Date 11/26/19      PT LONG TERM GOAL #5   Title The patient will tolerate gaze x 1 x 1 minutes with dizziness < or equal to 2/10    Baseline 6/10  after 30 seconds    Time 6    Period Weeks    Target Date 11/26/19                  Plan - 10/15/19 1138    Clinical Impression Statement The patient is a 54 yo male known to me from prior PT for P.O.T.S in 2017 at neuro rehab clinic @ [place].   Since that time, he has undergone heart surgery for ascending aortic aneurysm, workup for neurologic deficits.  He presents today with impairments of dec'd balance, dec'd dynamic gait, impaired sensation, dec'd VOR (per positive bilat head impulse test), and abnormality of gait.  He also notes varying tolerance to activity.  PT to address deficits to promote improved functional status.    Personal Factors and Comorbidities Comorbidity 3+    Comorbidities aortic aneurysm repair, h/o dysautonomia, h/o syncope, h/o pacemaker placement    Examination-Activity Limitations Locomotion Level;Stairs;Squat;Stand    Examination-Participation Restrictions Cleaning;Community Activity;Meal Prep;Yard Work    Midwife Evolving/Moderate complexity    Clinical Decision Making Moderate    Rehab Potential Good    PT Frequency 2x / week    PT Duration 6 weeks    PT Treatment/Interventions ADLs/Self Care Home Management;Gait training;Stair training;Functional mobility training;Therapeutic activities;Therapeutic exercise;Balance training;Neuromuscular re-education;Patient/family education;Manual techniques;Taping;DME Instruction;Aquatic Therapy    PT Next Visit Plan progress gaze adaptation, establish conditioning program, LE strengthening (functional squats), activity tolerance    PT Home Exercise Plan Access Code: [email or web address]    Consulted and Agree with Plan of Care Patient           Patient will benefit from skilled therapeutic intervention in order to improve the following deficits and impairments:  Abnormal gait, Dizziness, Decreased endurance, Decreased activity tolerance, Decreased balance, Impaired sensation  Visit Diagnosis: Other abnormalities of gait and mobility  Unsteadiness on feet  Dizziness and giddiness  Other symptoms and signs involving the nervous system     Problem List Patient Active Problem List   Diagnosis Date Noted  . Neuropathic pain 03/05/2019  . 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 Autonomic dysfunction 04/18/2018  . Cough due to ACE inhibitor 04/18/2018  . Essential hypertension 04/02/2018  . Acute post-operative pain 12/21/2017  . Atrial fibrillation (HCC) 12/21/2017  . Other specified postprocedural states 12/21/2017  . Small fiber neuropathy 05/31/2017  . Hypersomnia 05/30/2017  . Atypical chest pain 04/17/2017  . Bicuspid aortic valve 07/20/2016  . Cervical disc disorder with myelopathy 06/26/2016  . Constipation 08/19/2015  . Dizziness 08/19/2015  . Cardiac pacemaker 11/13/2014  . Abnormal tilt table test 11/06/2014  . History of syncope 11/06/2014  . Mixed hyperlipidemia 11/06/2014  . Symptomatic bradycardia 11/06/2014  . Aneurysm, ascending  aorta (HCC) 05/19/2014  . Thoracic aortic aneurysm without rupture (HCC) 05/19/2014  . Near syncope 05/18/2014  . Syncope 05/18/2014    Kajsa Butrum, PT 10/15/2019, 11:46 AM  [facility] [address] [place], [place], [address] Phone: [phone]   Fax:  [phone]  Name: Jason Osborn MRN: [id number] Date of Birth: Oct 08, 1965

## 2019-10-22 ENCOUNTER — Other Ambulatory Visit: Payer: Self-pay

## 2019-10-22 ENCOUNTER — Ambulatory Visit (INDEPENDENT_AMBULATORY_CARE_PROVIDER_SITE_OTHER): Payer: BC Managed Care – PPO | Admitting: Rehabilitative and Restorative Service Providers"

## 2019-10-22 ENCOUNTER — Encounter: Payer: Self-pay | Admitting: Rehabilitative and Restorative Service Providers"

## 2019-10-22 DIAGNOSIS — R42 Dizziness and giddiness: Secondary | ICD-10-CM

## 2019-10-22 DIAGNOSIS — R29818 Other symptoms and signs involving the nervous system: Secondary | ICD-10-CM | POA: Diagnosis not present

## 2019-10-22 DIAGNOSIS — R2689 Other abnormalities of gait and mobility: Secondary | ICD-10-CM

## 2019-10-22 DIAGNOSIS — R2681 Unsteadiness on feet: Secondary | ICD-10-CM

## 2019-10-22 NOTE — Therapy (Signed)
[facility] [place] [address] [place], [place], [address] Phone: [phone]   Fax:  [phone]  Physical Therapy Treatment  Patient Details  Name: Jason Osborn MRN: [id number] Date of Birth: 1965/05/26 Referring Provider (PT): Jefferey Pica, MD   Encounter Date: 10/22/2019   PT End of Session - 10/22/19 1218    Visit Number 2    Number of Visits 12    Date for PT Re-Evaluation 11/26/19    Authorization Type BCBS    PT Start Time 1108    PT Stop Time 1148    PT Time Calculation (min) 40 min    Activity Tolerance Patient tolerated treatment well    Behavior During Therapy [facility] for tasks assessed/performed           Past Medical History:  Diagnosis Date   Aneurysm, ascending aorta (HCC) 05/19/2014   Atrial fibrillation (HCC)    POST OPERATIVE   Atypical chest pain    NEGATIVE ADENOSINE CMR 05/23/17, NORMAL RIGHT AND LEFT HEART CATH AT [facility] ON 12/19/17   Bicuspid aortic valve    Cardiac pacemaker in situ    Dizziness    Elevated liver function tests    Essential hypertension    Fatty liver    Hypercholesteremia    Hyperlipidemia    Hypertension    Left elbow pain    Near syncope 05/18/2014   Neuropathy    Syncope 05/18/2014   CARDIOINHIBITOR SYNCOPE S/P PPM S/P DISRUPTION OF ATRIAL LEAD FROM ABOVE SURGICAL PROCEDURE S/P LASER LEAD EXTRACTION/VISION 10/11 AT [facility]   Tendinopathy of elbow    Thoracic aortic aneurysm (HCC)    S/P ASCENDING HEMIARCH WITH AV REPAIR AT [facility] ON 12/20/17    Past Surgical History:  Procedure Laterality Date   KNEE ARTHROSCOPY  2004, 1986   PACEMAKER INSERTION      There were no vitals filed for this visit.   Subjective Assessment - 10/22/19 1110    Subjective The patient reports he has been doign yard work for short durations due to heat.    Patient Stated Goals Instability and fatigue are 2 largest complaints. His goals are improved stability,  reducing fatigue.    Currently in Pain? Yes    Pain Score 8    10 before medication   Pain Location Foot    Pain Orientation Right;Left    Pain Type Chronic pain    Pain Onset More than a month ago    Pain Frequency Constant    Pain Relieving Factors medication                             [facility] Adult PT Treatment/Exercise - 10/22/19 1113      Neuro Re-ed    Neuro Re-ed Details  Standing gaze adapation exercises x 15 reps horiz and vertical       Exercises   Exercises Knee/Hip;Ankle;Lumbar      Lumbar Exercises: Quadruped   Opposite Arm/Leg Raise 10 reps;Right arm/Left leg;Left arm/Right leg    Other Quadruped Lumbar Exercises quadriped to down dog x 2 reps and then attempted walking out into plank position      Knee/Hip Exercises: Aerobic   Elliptical 3 minutes at level 2.0      Knee/Hip Exercises: Standing   Heel Raises Right;Left;20 reps    Heel Raises Limitations with finger tip touch    Side Lunges Right;Left;5 reps    Side Lunges  Limitations side step lunge squats    Hip Abduction Stengthening;Right;Left;10 reps    Abduction Limitations x band walking with green t-band    Functional Squat 5 reps;2 sets    Functional Squat Limitations using chair to encourage correct technique    SLS 15 seconds each leg with finger tip touch    SLS with Vectors lateral reaching x 5 reps iwth finger tip touch                  PT Education - 10/22/19 1143    Education Details HEP updated.    Person(s) Educated Patient    Methods Explanation;Demonstration;Handout    Comprehension Returned demonstration;Verbalized understanding               PT Long Term Goals - 10/15/19 1136      PT LONG TERM GOAL #1   Title The patient will return demo HEP for conditioning, balance, gaze, and general mobility.    Time 6    Period Weeks    Target Date 11/26/19      PT LONG TERM GOAL #2   Title The patient will reduce functional limitations per FOTO improving  from 60% limited to < or equal to 48% limited.    Time 6    Period Weeks    Target Date 11/26/19      PT LONG TERM GOAL #3   Title The patient will improve FGA from 18/30 to > or equal to 24/30 demonstrating improved dynamic stability.    Time 6    Period Weeks    Target Date 11/26/19      PT LONG TERM GOAL #4   Title The patient will demonstrate 20 minutes of standing functional activity (picking up objects, walking, stairs, standing ex) to demo improved activity tolerance.    Time 6    Period Weeks    Target Date 11/26/19      PT LONG TERM GOAL #5   Title The patient will tolerate gaze x 1 x 1 minutes with dizziness < or equal to 2/10    Baseline 6/10 after 30 seconds    Time 6    Period Weeks    Target Date 11/26/19                 Plan - 10/22/19 1225    Clinical Impression Statement The patient tolerated LE strengthening and progression of gaze adaptation exercises.  PT recommended aerobic program on treadmill to begin to work on endurance training.  Plan to continue to progress to patient tolerance.    Comorbidities aortic aneurysm repair, h/o dysautonomia, h/o syncope, h/o pacemaker placement    PT Frequency 2x / week    PT Duration 6 weeks    PT Treatment/Interventions ADLs/Self Care Home Management;Gait training;Stair training;Functional mobility training;Therapeutic activities;Therapeutic exercise;Balance training;Neuromuscular re-education;Patient/family education;Manual techniques;Taping;DME Instruction;Aquatic Therapy    PT Next Visit Plan progress gaze adaptation, establish conditioning program, LE strengthening (functional squats), activity tolerance    PT Home Exercise Plan Access Code: [email or web address]    Consulted and Agree with Plan of Care Patient           Patient will benefit from skilled therapeutic intervention in order to improve the following deficits and impairments:  Abnormal gait, Dizziness, Decreased endurance, Decreased activity tolerance,  Decreased balance, Impaired sensation  Visit Diagnosis: Other abnormalities of gait and mobility  Unsteadiness on feet  Dizziness and giddiness  Other symptoms and signs involving the nervous system  Problem List Patient Active Problem List   Diagnosis Date Noted   Neuropathic pain 03/05/2019   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   Autonomic dysfunction 04/18/2018   Cough due to ACE inhibitor 04/18/2018   Essential hypertension 04/02/2018   Acute post-operative pain 12/21/2017   Atrial fibrillation (HCC) 12/21/2017   Other specified postprocedural states 12/21/2017   Small fiber neuropathy 05/31/2017   Hypersomnia 05/30/2017   Atypical chest pain 04/17/2017   Bicuspid aortic valve 07/20/2016   Cervical disc disorder with myelopathy 06/26/2016   Constipation 08/19/2015   Dizziness 08/19/2015   Cardiac pacemaker 11/13/2014   Abnormal tilt table test 11/06/2014   History of syncope 11/06/2014   Mixed hyperlipidemia 11/06/2014   Symptomatic bradycardia 11/06/2014   Aneurysm, ascending aorta (HCC) 05/19/2014   Thoracic aortic aneurysm without rupture (HCC) 05/19/2014   Near syncope 05/18/2014   Syncope 05/18/2014    Brainard Highfill , PT 10/22/2019, 12:28 PM  [facility] [place] [address] [place], [place], [address] Phone: [phone]   Fax:  [phone]  Name: Jason Osborn MRN: [id number] Date of Birth: 01/24/1966

## 2019-10-22 NOTE — Patient Instructions (Signed)
Access Code: [email or web address] URL: [email or web address] Date: 10/22/2019 Prepared by: Margretta Ditty  Exercises Standing Gaze Stabilization with Head Rotation - 2 x daily - 7 x weekly - 1 sets - 15 reps Standing Gaze Stabilization with Head Nod - 2 x daily - 7 x weekly - 1 sets - 15 reps X Band Walk - 2 x daily - 7 x weekly - 1 sets - 10 reps Squat with Chair Touch - 2 x daily - 7 x weekly - 1 sets - 10 reps Standing Single Leg Heel Raise - 2 x daily - 7 x weekly - 1 sets - 20 reps Single Leg Balance with Clock Reach - 2 x daily - 7 x weekly - 1 sets - 5 reps

## 2019-10-24 ENCOUNTER — Encounter: Payer: Self-pay | Admitting: Rehabilitative and Restorative Service Providers"

## 2019-10-24 ENCOUNTER — Ambulatory Visit (INDEPENDENT_AMBULATORY_CARE_PROVIDER_SITE_OTHER): Payer: BC Managed Care – PPO | Admitting: Rehabilitative and Restorative Service Providers"

## 2019-10-24 ENCOUNTER — Other Ambulatory Visit: Payer: Self-pay

## 2019-10-24 DIAGNOSIS — R29818 Other symptoms and signs involving the nervous system: Secondary | ICD-10-CM | POA: Diagnosis not present

## 2019-10-24 DIAGNOSIS — R2681 Unsteadiness on feet: Secondary | ICD-10-CM

## 2019-10-24 DIAGNOSIS — R2689 Other abnormalities of gait and mobility: Secondary | ICD-10-CM | POA: Diagnosis not present

## 2019-10-24 DIAGNOSIS — R42 Dizziness and giddiness: Secondary | ICD-10-CM

## 2019-10-24 NOTE — Therapy (Signed)
[facility] [place] [address] [place], [place], [address] Phone: [phone]   Fax:  [phone]  Physical Therapy Treatment  Patient Details  Name: Jason Osborn MRN: [id number] Date of Birth: 1965-06-23 Referring Provider (PT): Jefferey Pica, MD   Encounter Date: 10/24/2019   PT End of Session - 10/24/19 1142    Visit Number 3    Number of Visits 12    Date for PT Re-Evaluation 11/26/19    Authorization Type BCBS    PT Start Time 1018    PT Stop Time 1100    PT Time Calculation (min) 42 min    Activity Tolerance Patient tolerated treatment well    Behavior During Therapy [facility] for tasks assessed/performed           Past Medical History:  Diagnosis Date  . Aneurysm, ascending aorta (HCC) 05/19/2014  . Atrial fibrillation (HCC)    POST OPERATIVE  . Atypical chest pain    NEGATIVE ADENOSINE CMR 05/23/17, NORMAL RIGHT AND LEFT HEART CATH AT [facility] ON 12/19/17  . Bicuspid aortic valve   . Cardiac pacemaker in situ   . Dizziness   . Elevated liver function tests   . Essential hypertension   . Fatty liver   . Hypercholesteremia   . Hyperlipidemia   . Hypertension   . Left elbow pain   . Near syncope 05/18/2014  . Neuropathy   . Syncope 05/18/2014   CARDIOINHIBITOR SYNCOPE S/P PPM S/P DISRUPTION OF ATRIAL LEAD FROM ABOVE SURGICAL PROCEDURE S/P LASER LEAD EXTRACTION/VISION 10/11 AT [facility]  . Tendinopathy of elbow   . Thoracic aortic aneurysm (HCC)    S/P ASCENDING HEMIARCH WITH AV REPAIR AT [facility] ON 12/20/17    Past Surgical History:  Procedure Laterality Date  . KNEE ARTHROSCOPY  2004, 1986  . PACEMAKER INSERTION      There were no vitals filed for this visit.   Subjective Assessment - 10/24/19 1107    Subjective The patient reports he is having a bad day with rubbery sensation legs.    Patient Stated Goals Instability and fatigue are 2 largest complaints. His goals are improved stability, reducing  fatigue.    Currently in Pain? Yes    Pain Score 0-No pain    Pain Location Foot    Pain Relieving Factors meds                             [facility] Adult PT Treatment/Exercise - 10/24/19 1108      Ambulation/Gait   Ambulation/Gait Yes    Ambulation/Gait Assistance 7: Independent    Ambulation Distance (Feet) 120 Feet    Assistive device None    Ambulation Surface Level;Indoor    Gait Comments Gait activities adding tandem walking, heel walking, toe walking, marching 3 alternating marches + 3 second hold for balance and postural control (with cues on core engagement)      Neuro Re-ed    Neuro Re-ed Details  Standing on compliant surfaces with eyes closed x 30 se conds.      Exercises   Exercises Knee/Hip;Ankle;Lumbar      Lumbar Exercises: Quadruped   Opposite Arm/Leg Raise Right arm/Left leg;Left arm/Right leg;10 reps    Opposite Arm/Leg Raise Limitations adding head turns for balance/vestibular challenge    Other Quadruped Lumbar Exercises primal push up      Knee/Hip Exercises: Stretches   Active Hamstring Stretch Right;Left;1 rep;30 seconds  Quad Stretch Right;Left;1 rep;60 seconds    Hip Flexor Stretch Right;Left;1 rep;60 seconds    Piriformis Stretch Right;Left;1 rep;60 seconds    Gastroc Stretch Right;Left;2 reps;30 seconds      Knee/Hip Exercises: Aerobic   Elliptical 2 minutes level 2 with UE support      Knee/Hip Exercises: Standing   Heel Raises Both;Right;Left;10 reps    Step Down Right;Left;10 reps    Step Down Limitations on compliant foam 2" surface    Functional Squat 5 reps;5 seconds    Functional Squat Limitations lift from chair and hold for isometric contraction    SLS On compliant surfaces with contralateral L/e on ball and rhythmic stabilization                       PT Long Term Goals - 10/15/19 1136      PT LONG TERM GOAL #1   Title The patient will return demo HEP for conditioning, balance, gaze, and general  mobility.    Time 6    Period Weeks    Target Date 11/26/19      PT LONG TERM GOAL #2   Title The patient will reduce functional limitations per FOTO improving from 60% limited to < or equal to 48% limited.    Time 6    Period Weeks    Target Date 11/26/19      PT LONG TERM GOAL #3   Title The patient will improve FGA from 18/30 to > or equal to 24/30 demonstrating improved dynamic stability.    Time 6    Period Weeks    Target Date 11/26/19      PT LONG TERM GOAL #4   Title The patient will demonstrate 20 minutes of standing functional activity (picking up objects, walking, stairs, standing ex) to demo improved activity tolerance.    Time 6    Period Weeks    Target Date 11/26/19      PT LONG TERM GOAL #5   Title The patient will tolerate gaze x 1 x 1 minutes with dizziness < or equal to 2/10    Baseline 6/10 after 30 seconds    Time 6    Period Weeks    Target Date 11/26/19                 Plan - 10/24/19 1238    Clinical Impression Statement The patient tolerated dynamic balance and strengthening activities today.  He reports fatigue at end of session.  Plan to continue progressing to tolerance.    Comorbidities aortic aneurysm repair, h/o dysautonomia, h/o syncope, h/o pacemaker placement    PT Frequency 2x / week    PT Duration 6 weeks    PT Treatment/Interventions ADLs/Self Care Home Management;Gait training;Stair training;Functional mobility training;Therapeutic activities;Therapeutic exercise;Balance training;Neuromuscular re-education;Patient/family education;Manual techniques;Taping;DME Instruction;Aquatic Therapy    PT Next Visit Plan progress gaze adaptation, establish conditioning program, LE strengthening (functional squats), activity tolerance    PT Home Exercise Plan Access Code: [email or web address]    Consulted and Agree with Plan of Care Patient           Patient will benefit from skilled therapeutic intervention in order to improve the following deficits  and impairments:  Abnormal gait, Dizziness, Decreased endurance, Decreased activity tolerance, Decreased balance, Impaired sensation  Visit Diagnosis: Other abnormalities of gait and mobility  Unsteadiness on feet  Dizziness and giddiness  Other symptoms and signs involving the nervous system     Problem List  Patient Active Problem List   Diagnosis Date Noted  . Neuropathic pain 03/05/2019  . 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 Autonomic dysfunction 04/18/2018  . Cough due to ACE inhibitor 04/18/2018  . Essential hypertension 04/02/2018  . Acute post-operative pain 12/21/2017  . Atrial fibrillation (HCC) 12/21/2017  . Other specified postprocedural states 12/21/2017  . Small fiber neuropathy 05/31/2017  . Hypersomnia 05/30/2017  . Atypical chest pain 04/17/2017  . Bicuspid aortic valve 07/20/2016  . Cervical disc disorder with myelopathy 06/26/2016  . Constipation 08/19/2015  . Dizziness 08/19/2015  . Cardiac pacemaker 11/13/2014  . Abnormal tilt table test 11/06/2014  . History of syncope 11/06/2014  . Mixed hyperlipidemia 11/06/2014  . Symptomatic bradycardia 11/06/2014  . Aneurysm, ascending aorta (HCC) 05/19/2014  . Thoracic aortic aneurysm without rupture (HCC) 05/19/2014  . Near syncope 05/18/2014  . Syncope 05/18/2014    Zahmir Lalla, PT 10/24/2019, 12:39 PM  [facility] [address] [place], [place], [address] Phone: [phone]   Fax:  [phone]  Name: Jason Osborn MRN: [id number] Date of Birth: 22-Jun-1965

## 2019-10-28 ENCOUNTER — Ambulatory Visit (INDEPENDENT_AMBULATORY_CARE_PROVIDER_SITE_OTHER): Payer: BC Managed Care – PPO | Admitting: Physical Therapy

## 2019-10-28 ENCOUNTER — Other Ambulatory Visit: Payer: Self-pay

## 2019-10-28 DIAGNOSIS — R2681 Unsteadiness on feet: Secondary | ICD-10-CM | POA: Diagnosis not present

## 2019-10-28 DIAGNOSIS — R2689 Other abnormalities of gait and mobility: Secondary | ICD-10-CM

## 2019-10-28 NOTE — Therapy (Signed)
[facility] [place] [address] [place], [place], [address] Phone: [phone]   Fax:  [phone]  Physical Therapy Treatment  Patient Details  Name: Jason Osborn MRN: [id number] Date of Birth: Feb 12, 1966 Referring Provider (PT): Jefferey Pica, MD   Encounter Date: 10/28/2019   PT End of Session - 10/28/19 1244    Visit Number 4    Number of Visits 12    Date for PT Re-Evaluation 11/26/19    Authorization Type BCBS    PT Start Time 1148    PT Stop Time 1230    PT Time Calculation (min) 42 min    Activity Tolerance Patient tolerated treatment well    Behavior During Therapy [facility] for tasks assessed/performed           Past Medical History:  Diagnosis Date  . Aneurysm, ascending aorta (HCC) 05/19/2014  . Atrial fibrillation (HCC)    POST OPERATIVE  . Atypical chest pain    NEGATIVE ADENOSINE CMR 05/23/17, NORMAL RIGHT AND LEFT HEART CATH AT [facility] ON 12/19/17  . Bicuspid aortic valve   . Cardiac pacemaker in situ   . Dizziness   . Elevated liver function tests   . Essential hypertension   . Fatty liver   . Hypercholesteremia   . Hyperlipidemia   . Hypertension   . Left elbow pain   . Near syncope 05/18/2014  . Neuropathy   . Syncope 05/18/2014   CARDIOINHIBITOR SYNCOPE S/P PPM S/P DISRUPTION OF ATRIAL LEAD FROM ABOVE SURGICAL PROCEDURE S/P LASER LEAD EXTRACTION/VISION 10/11 AT [facility]  . Tendinopathy of elbow   . Thoracic aortic aneurysm (HCC)    S/P ASCENDING HEMIARCH WITH AV REPAIR AT [facility] ON 12/20/17    Past Surgical History:  Procedure Laterality Date  . KNEE ARTHROSCOPY  2004, 1986  . PACEMAKER INSERTION      There were no vitals filed for this visit.   Subjective Assessment - 10/28/19 1149    Subjective Pt reports he felt fatigued after last session; it took him a while to recover.  Heel raises make his "neuropathy bad at night".  "Today is a normal day".    Patient Stated Goals Instability and  fatigue are 2 largest complaints. His goals are improved stability, reducing fatigue.    Currently in Pain? No/denies    Pain Score 0-No pain              [facility] PT Assessment - 10/28/19 0001      Assessment   Medical Diagnosis demyelinating disease of spinal cord    Referring Provider (PT) Christiana Pellant Pardo-Villamizar, MD    Hand Dominance Right    Prior Therapy known to me from prior PT at neuro rehab, has had OP rehab at [facility] Adult PT Treatment/Exercise - 10/28/19 0001      Lumbar Exercises: Quadruped   Opposite Arm/Leg Raise Right arm/Left leg;Left arm/Right leg;10 reps    Opposite Arm/Leg Raise Limitations adding head turns for balance/vestibular challenge    Other Quadruped Lumbar Exercises primal push up x  10 sec x 3 reps       Knee/Hip Exercises: Stretches   Passive Hamstring Stretch Right;Left;1 rep;30 seconds    Piriformis Stretch Right;Left;1 rep;30 seconds    Gastroc Stretch Right;Left;1 rep      Knee/Hip Exercises: Aerobic   Elliptical 3 minutes level 2 with UE support  Balance Exercises - 10/28/19 0001      Balance Exercises: Standing   Standing Eyes Closed Wide (BOA);Foam/compliant surface;1 rep;20 secs;Narrow base of support (BOS);10 secs    SLS Eyes open;Solid surface;Foam/compliant surface;2 reps;15 secs    Gait with Head Turns Forward;2 reps    Tandem Gait Intermittent upper extremity support;3 reps   forward and backward    Other Standing Exercises pivoting x 5 reps each direction.   forward gait, with SLS for 3 sec every third step, without UE support (CGA for safety) x 40 ft    Other Standing Exercises Comments Warrior 2 pose x 15 sec each side.                   PT Long Term Goals - 10/15/19 1136      PT LONG TERM GOAL #1   Title The patient will return demo HEP for conditioning, balance, gaze, and general mobility.    Time 6    Period Weeks    Target Date 11/26/19      PT LONG TERM  GOAL #2   Title The patient will reduce functional limitations per FOTO improving from 60% limited to < or equal to 48% limited.    Time 6    Period Weeks    Target Date 11/26/19      PT LONG TERM GOAL #3   Title The patient will improve FGA from 18/30 to > or equal to 24/30 demonstrating improved dynamic stability.    Time 6    Period Weeks    Target Date 11/26/19      PT LONG TERM GOAL #4   Title The patient will demonstrate 20 minutes of standing functional activity (picking up objects, walking, stairs, standing ex) to demo improved activity tolerance.    Time 6    Period Weeks    Target Date 11/26/19      PT LONG TERM GOAL #5   Title The patient will tolerate gaze x 1 x 1 minutes with dizziness < or equal to 2/10    Baseline 6/10 after 30 seconds    Time 6    Period Weeks    Target Date 11/26/19                 Plan - 10/28/19 1240    Clinical Impression Statement Pt demonstrates decreased static standing balance with Rt SLS exercises, as well as horiz head turns (R>L) with both static poses and dynamic gait.   Pt participated well throughout, taking 4 seated short rest breaks during session due to fatigue.  Plan to continue progressive to tolerance.    Comorbidities aortic aneurysm repair, h/o dysautonomia, h/o syncope, h/o pacemaker placement    Rehab Potential Good    PT Frequency 2x / week    PT Duration 6 weeks    PT Treatment/Interventions ADLs/Self Care Home Management;Gait training;Stair training;Functional mobility training;Therapeutic activities;Therapeutic exercise;Balance training;Neuromuscular re-education;Patient/family education;Manual techniques;Taping;DME Instruction;Aquatic Therapy    PT Next Visit Plan progress gaze adaptation, establish conditioning program, LE strengthening (functional squats), activity tolerance    PT Home Exercise Plan Access Code: [email or web address]    Consulted and Agree with Plan of Care Patient           Patient will benefit from  skilled therapeutic intervention in order to improve the following deficits and impairments:  Abnormal gait, Dizziness, Decreased endurance, Decreased activity tolerance, Decreased balance, Impaired sensation  Visit Diagnosis: Other abnormalities of gait and mobility  Unsteadiness on feet  Problem List Patient Active Problem List   Diagnosis Date Noted  . Neuropathic pain 03/05/2019  . 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 Autonomic dysfunction 04/18/2018  . Cough due to ACE inhibitor 04/18/2018  . Essential hypertension 04/02/2018  . Acute post-operative pain 12/21/2017  . Atrial fibrillation (HCC) 12/21/2017  . Other specified postprocedural states 12/21/2017  . Small fiber neuropathy 05/31/2017  . Hypersomnia 05/30/2017  . Atypical chest pain 04/17/2017  . Bicuspid aortic valve 07/20/2016  . Cervical disc disorder with myelopathy 06/26/2016  . Constipation 08/19/2015  . Dizziness 08/19/2015  . Cardiac pacemaker 11/13/2014  . Abnormal tilt table test 11/06/2014  . History of syncope 11/06/2014  . Mixed hyperlipidemia 11/06/2014  . Symptomatic bradycardia 11/06/2014  . Aneurysm, ascending aorta (HCC) 05/19/2014  . Thoracic aortic aneurysm without rupture (HCC) 05/19/2014  . Near syncope 05/18/2014  . Syncope 05/18/2014   Mayer Camel, PTA 10/28/19 12:50 PM  [facility] [place] [address] [place], [place], [address] Phone: [phone]   Fax:  [phone]  Name: Jason Osborn MRN: [id number] Date of Birth: 1965/04/17

## 2019-10-30 ENCOUNTER — Encounter: Payer: Self-pay | Admitting: Rehabilitative and Restorative Service Providers"

## 2019-10-30 ENCOUNTER — Ambulatory Visit (INDEPENDENT_AMBULATORY_CARE_PROVIDER_SITE_OTHER): Payer: BC Managed Care – PPO | Admitting: Rehabilitative and Restorative Service Providers"

## 2019-10-30 ENCOUNTER — Other Ambulatory Visit: Payer: Self-pay

## 2019-10-30 DIAGNOSIS — R2681 Unsteadiness on feet: Secondary | ICD-10-CM

## 2019-10-30 DIAGNOSIS — R2689 Other abnormalities of gait and mobility: Secondary | ICD-10-CM | POA: Diagnosis not present

## 2019-10-30 DIAGNOSIS — R42 Dizziness and giddiness: Secondary | ICD-10-CM | POA: Diagnosis not present

## 2019-10-30 DIAGNOSIS — R29818 Other symptoms and signs involving the nervous system: Secondary | ICD-10-CM

## 2019-10-30 NOTE — Therapy (Signed)
[facility] [place] [address] [place], [place], [address] Phone: [phone]   Fax:  [phone]  Physical Therapy Treatment  Patient Details  Name: Jason Osborn MRN: [id number] Date of Birth: 1965-07-12 Referring Provider (PT): Jefferey Pica, MD   Encounter Date: 10/30/2019   PT End of Session - 10/30/19 1210    Visit Number 5    Number of Visits 12    Date for PT Re-Evaluation 11/26/19    Authorization Type BCBS    PT Start Time 1148    PT Stop Time 1235    PT Time Calculation (min) 47 min    Activity Tolerance Patient tolerated treatment well    Behavior During Therapy [facility] for tasks assessed/performed           Past Medical History:  Diagnosis Date  . Aneurysm, ascending aorta (HCC) 05/19/2014  . Atrial fibrillation (HCC)    POST OPERATIVE  . Atypical chest pain    NEGATIVE ADENOSINE CMR 05/23/17, NORMAL RIGHT AND LEFT HEART CATH AT [facility] ON 12/19/17  . Bicuspid aortic valve   . Cardiac pacemaker in situ   . Dizziness   . Elevated liver function tests   . Essential hypertension   . Fatty liver   . Hypercholesteremia   . Hyperlipidemia   . Hypertension   . Left elbow pain   . Near syncope 05/18/2014  . Neuropathy   . Syncope 05/18/2014   CARDIOINHIBITOR SYNCOPE S/P PPM S/P DISRUPTION OF ATRIAL LEAD FROM ABOVE SURGICAL PROCEDURE S/P LASER LEAD EXTRACTION/VISION 10/11 AT [facility]  . Tendinopathy of elbow   . Thoracic aortic aneurysm (HCC)    S/P ASCENDING HEMIARCH WITH AV REPAIR AT [facility] ON 12/20/17    Past Surgical History:  Procedure Laterality Date  . KNEE ARTHROSCOPY  2004, 1986  . PACEMAKER INSERTION      There were no vitals filed for this visit.   Subjective Assessment - 10/30/19 1151    Subjective The patient reports he did fine after last session.  He was walking around in the yard yesterday doingm arches.    Patient Stated Goals Instability and fatigue are 2 largest complaints. His  goals are improved stability, reducing fatigue.    Currently in Pain? No/denies              [facility] PT Assessment - 10/30/19 1210      Assessment   Medical Diagnosis demyelinating disease of spinal cord    Referring Provider (PT) Christiana Pellant Pardo-Villamizar, MD      Functional Gait  Assessment   Gait Level Surface Walks 20 ft in less than 7 sec but greater than 5.5 sec, uses assistive device, slower speed, mild gait deviations, or deviates 6-10 in outside of the 12 in walkway width.    Gait and Pivot Turn Pivot turns safely within 3 sec and stops quickly with no loss of balance.    Gait with Narrow Base of Support Is able to ambulate for 10 steps heel to toe with no staggering.    Ambulating Backwards Walks 20 ft, no assistive devices, good speed, no evidence for imbalance, normal gait                         [facility] Adult PT Treatment/Exercise - 10/30/19 1210      Ambulation/Gait   Ambulation/Gait Yes    Ambulation/Gait Assistance 7: Independent    Ambulation Distance (Feet) 300 Feet  Assistive device None    Pre-Gait Activities tandem gait, backweards walking    Gait Comments jogging x 12 feet with close supervision; standing high marches with holds x 3 seconds      High Level Balance   High Level Balance Comments single leg stance; agility ladder quick marching wide<>narrow, diagonal marching through agility ladder, and hopping bilateral LEs near support surface      Neuro Re-ed    Neuro Re-ed Details  BOSU standing dec'ing UE support with reaching, BOSU mini marches x 10 reps      Exercises   Exercises Knee/Hip;Ankle;Lumbar      Lumbar Exercises: Quadruped   Opposite Arm/Leg Raise Right arm/Left leg;Left arm/Right leg;5 reps    Opposite Arm/Leg Raise Limitations adding head turns for balance/vestibular challenge    Other Quadruped Lumbar Exercises quadriped to down dog to extended push up position x 5 repetitions      Knee/Hip Exercises: Stretches    Active Hamstring Stretch Right;Left;1 rep;30 seconds    Piriformis Stretch Right;Left;1 rep;30 seconds      Knee/Hip Exercises: Aerobic   Elliptical 3 minutes level 2.0                       PT Long Term Goals - 10/15/19 1136      PT LONG TERM GOAL #1   Title The patient will return demo HEP for conditioning, balance, gaze, and general mobility.    Time 6    Period Weeks    Target Date 11/26/19      PT LONG TERM GOAL #2   Title The patient will reduce functional limitations per FOTO improving from 60% limited to < or equal to 48% limited.    Time 6    Period Weeks    Target Date 11/26/19      PT LONG TERM GOAL #3   Title The patient will improve FGA from 18/30 to > or equal to 24/30 demonstrating improved dynamic stability.    Time 6    Period Weeks    Target Date 11/26/19      PT LONG TERM GOAL #4   Title The patient will demonstrate 20 minutes of standing functional activity (picking up objects, walking, stairs, standing ex) to demo improved activity tolerance.    Time 6    Period Weeks    Target Date 11/26/19      PT LONG TERM GOAL #5   Title The patient will tolerate gaze x 1 x 1 minutes with dizziness < or equal to 2/10    Baseline 6/10 after 30 seconds    Time 6    Period Weeks    Target Date 11/26/19                 Plan - 10/30/19 1246    Clinical Impression Statement The patient fatigued with lightheadedness with higher intensity exercises.  PT and patient discussed if interventions in PT are improving functional activities.  PT plans to discuss as it relates to frequency/duration of therapy.  Will discuss PT outcomes and options.    Comorbidities --    Rehab Potential Good    PT Frequency 2x / week    PT Duration 6 weeks    PT Treatment/Interventions ADLs/Self Care Home Management;Gait training;Stair training;Functional mobility training;Therapeutic activities;Therapeutic exercise;Balance training;Neuromuscular re-education;Patient/family  education;Manual techniques;Taping;DME Instruction;Aquatic Therapy    PT Next Visit Plan progress gaze adaptation, establish conditioning program, LE strengthening (functional squats), activity tolerance; continue to  assess need for PT-- is this dec'ing falls or reducing LE heaviness over time?    PT Home Exercise Plan Access Code: [email or web address]    Consulted and Agree with Plan of Care Patient           Patient will benefit from skilled therapeutic intervention in order to improve the following deficits and impairments:     Visit Diagnosis: Other abnormalities of gait and mobility  Unsteadiness on feet  Dizziness and giddiness  Other symptoms and signs involving the nervous system     Problem List Patient Active Problem List   Diagnosis Date Noted  . Neuropathic pain 03/05/2019  . 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 Autonomic dysfunction 04/18/2018  . Cough due to ACE inhibitor 04/18/2018  . Essential hypertension 04/02/2018  . Acute post-operative pain 12/21/2017  . Atrial fibrillation (HCC) 12/21/2017  . Other specified postprocedural states 12/21/2017  . Small fiber neuropathy 05/31/2017  . Hypersomnia 05/30/2017  . Atypical chest pain 04/17/2017  . Bicuspid aortic valve 07/20/2016  . Cervical disc disorder with myelopathy 06/26/2016  . Constipation 08/19/2015  . Dizziness 08/19/2015  . Cardiac pacemaker 11/13/2014  . Abnormal tilt table test 11/06/2014  . History of syncope 11/06/2014  . Mixed hyperlipidemia 11/06/2014  . Symptomatic bradycardia 11/06/2014  . Aneurysm, ascending aorta (HCC) 05/19/2014  . Thoracic aortic aneurysm without rupture (HCC) 05/19/2014  . Near syncope 05/18/2014  . Syncope 05/18/2014    Triniti Gruetzmacher, PT 10/30/2019, 12:49 PM  [facility] [address] [place], [place], [address] Phone: [phone]   Fax:  [phone]  Name: Viren Lebeau MRN: [id number] Date of Birth:  1965/04/29

## 2019-11-04 ENCOUNTER — Ambulatory Visit (INDEPENDENT_AMBULATORY_CARE_PROVIDER_SITE_OTHER): Payer: BC Managed Care – PPO | Admitting: Physical Therapy

## 2019-11-04 ENCOUNTER — Other Ambulatory Visit: Payer: Self-pay

## 2019-11-04 DIAGNOSIS — R42 Dizziness and giddiness: Secondary | ICD-10-CM

## 2019-11-04 DIAGNOSIS — R2681 Unsteadiness on feet: Secondary | ICD-10-CM

## 2019-11-04 DIAGNOSIS — R2689 Other abnormalities of gait and mobility: Secondary | ICD-10-CM

## 2019-11-04 NOTE — Therapy (Signed)
[place], [place], [address] Phone: [phone]   Fax:  [phone]  Physical Therapy Treatment  Patient Details  Name: Jason Osborn MRN: [id number] Date of Birth: 04-28-1965 Referring Provider (PT): Feliz Beam, MD   Encounter Date: 11/04/2019   PT End of Session - 11/04/19 1715    Visit Number 6    Number of Visits 12    Date for PT Re-Evaluation 11/26/19    Authorization Type BCBS    PT Start Time 1149    PT Stop Time 1234    PT Time Calculation (min) 45 min    Activity Tolerance Patient tolerated treatment well    Behavior During Therapy [facility] for tasks assessed/performed           Past Medical History:  Diagnosis Date  . Aneurysm, ascending aorta ([place]) 05/19/2014  . Atrial fibrillation (HCC)    POST OPERATIVE  . Atypical chest pain    NEGATIVE ADENOSINE CMR 05/23/17, NORMAL RIGHT AND LEFT HEART CATH AT [facility] ON 12/19/17  . Bicuspid aortic valve   . Cardiac pacemaker in situ   . Dizziness   . Elevated liver function tests   . Essential hypertension   . Fatty liver   . Hypercholesteremia   . Hyperlipidemia   . Hypertension   . Left elbow pain   . Near syncope 05/18/2014  . Neuropathy   . Syncope 05/18/2014   CARDIOINHIBITOR SYNCOPE S/P PPM S/P DISRUPTION OF ATRIAL LEAD FROM ABOVE SURGICAL PROCEDURE S/P LASER LEAD EXTRACTION/VISION 10/11 AT [facility]  . Tendinopathy of elbow   . Thoracic aortic aneurysm (HCC)    S/P 28MM ASCENDING HEMIARCH WITH AV REPAIR AT [facility] ON 12/20/17    Past Surgical History:  Procedure Laterality Date  . KNEE ARTHROSCOPY  2004, 1986  . PACEMAKER INSERTION      There were no vitals filed for this visit.   Subjective Assessment - 11/04/19 1153    Subjective Pt reports he will have med changes starting today, since visit with [facility].  He complains of increased fatigue and brain fog today.  He is unsure if he can see any changes yet    Patient Stated  Goals Instability and fatigue are 2 largest complaints. His goals are improved stability, reducing fatigue.    Currently in Pain? No/denies    Pain Score 0-No pain              [facility] PT Assessment - 11/04/19 0001      Assessment   Medical Diagnosis demyelinating disease of spinal cord    Referring Provider (PT) Adair Laundry Pardo-Villamizar, MD    Hand Dominance Right    Prior Therapy known to me from prior PT at neuro rehab, has had OP rehab at [facility] Adult PT Treatment/Exercise - 11/04/19 0001      Ambulation/Gait   Pre-Gait Activities marching on mini-tramp with horiz and vertical head turns (Lt head turns challenging)       Lumbar Exercises: Quadruped   Opposite Arm/Leg Raise Right arm/Left leg;Left arm/Right leg;5 reps   5 sec holds     Knee/Hip Exercises: Stretches   Passive Hamstring Stretch Right;Left;2 reps;20 seconds   seated with straight back   Gastroc Stretch Both;1 rep;30 seconds      Knee/Hip Exercises: Aerobic   Elliptical 3 minutes level 2.0  Balance Exercises - 11/04/19 0001      Balance Exercises: Standing   Tandem Stance Eyes open;Foam/compliant surface;3 reps;20 secs   on solid ground x 30 sec, 1/2 foam roller (flat side dwn)   SLS Eyes open;Foam/compliant surface;2 reps;Eyes closed   eyes closed 5 sec, open 15 sec   Wall Bumps Shoulder;Hip;Eyes opened;10 reps   side perterbations (in corner)    Tandem Gait 2 reps;Forward;Retro    Other Standing Exercises Rt SLS with Lt toe taps to cones x 10;  Standing balance on 1/2 foam roll (flat side up, perpendicular feet) working on ankle strategy for balance.  Rt / Lt step ups onto bosu x 5 reps each leg with intermittent UE support, cues for form.    Other Standing Exercises Comments warrior 2 pose with horiz head turns (slow) each leg forward, 30 sec                   PT Long Term Goals - 11/04/19 1244      PT LONG TERM GOAL #1   Title The patient  will return demo HEP for conditioning, balance, gaze, and general mobility.    Time 6    Period Weeks    Status On-going      PT LONG TERM GOAL #2   Title The patient will reduce functional limitations per FOTO improving from 60% limited to < or equal to 48% limited.    Time 6    Period Weeks    Status On-going      PT LONG TERM GOAL #3   Title The patient will improve FGA from 18/30 to > or equal to 24/30 demonstrating improved dynamic stability.    Time 6    Period Weeks    Status On-going      PT LONG TERM GOAL #4   Title The patient will demonstrate 20 minutes of standing functional activity (picking up objects, walking, stairs, standing ex) to demo improved activity tolerance.    Time 6    Period Weeks    Status Partially Met      PT LONG TERM GOAL #5   Title The patient will tolerate gaze x 1 x 1 minutes with dizziness < or equal to 2/10    Baseline 6/10 after 30 seconds    Time 6    Period Weeks    Status On-going                 Plan - 11/04/19 1250    Clinical Impression Statement Despite pt's report of increased fatigue upon arrival, pt tolerated standing exercises 20 min prior to need to have seated rest break.  Pt is challenged with Lt horiz head turns with static and dynamic movement of body; tends to lose balance posteriorly with this motion.  Pt required close SBA for standing dynamic balance exercises for safety.  Will continue to monitor for improvement of symptoms as sessions progress.    Rehab Potential Good    PT Frequency 2x / week    PT Duration 6 weeks    PT Treatment/Interventions ADLs/Self Care Home Management;Gait training;Stair training;Functional mobility training;Therapeutic activities;Therapeutic exercise;Balance training;Neuromuscular re-education;Patient/family education;Manual techniques;Taping;DME Instruction;Aquatic Therapy    PT Next Visit Plan progress gaze adaptation, establish conditioning program, LE strengthening (functional  squats), activity tolerance; continue to assess need for PT-- is this dec'ing falls or reducing LE heaviness over time?    PT Home Exercise Plan Access Code: [email or web address]    [email or web address] and Agree  with Plan of Care Patient           Patient will benefit from skilled therapeutic intervention in order to improve the following deficits and impairments:     Visit Diagnosis: Other abnormalities of gait and mobility  Unsteadiness on feet  Dizziness and giddiness     Problem List Patient Active Problem List   Diagnosis Date Noted  . Neuropathic pain 03/05/2019  . 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 Autonomic dysfunction 04/18/2018  . Cough due to ACE inhibitor 04/18/2018  . Essential hypertension 04/02/2018  . Acute post-operative pain 12/21/2017  . Atrial fibrillation ([place]) 12/21/2017  . Other specified postprocedural states 12/21/2017  . Small fiber neuropathy 05/31/2017  . Hypersomnia 05/30/2017  . Atypical chest pain 04/17/2017  . Bicuspid aortic valve 07/20/2016  . Cervical disc disorder with myelopathy 06/26/2016  . Constipation 08/19/2015  . Dizziness 08/19/2015  . Cardiac pacemaker 11/13/2014  . Abnormal tilt table test 11/06/2014  . History of syncope 11/06/2014  . Mixed hyperlipidemia 11/06/2014  . Symptomatic bradycardia 11/06/2014  . Aneurysm, ascending aorta ([place]) 05/19/2014  . Thoracic aortic aneurysm without rupture ([place]) 05/19/2014  . Near syncope 05/18/2014  . Syncope 05/18/2014   Kerin Perna, PTA 11/04/19 5:15 PM  [place], [place], [address] Phone: [phone]   Fax:  [phone]  Name: Jason Osborn MRN: [id number] Date of Birth: 09/06/1965

## 2019-11-07 ENCOUNTER — Other Ambulatory Visit: Payer: Self-pay

## 2019-11-07 ENCOUNTER — Ambulatory Visit (INDEPENDENT_AMBULATORY_CARE_PROVIDER_SITE_OTHER): Payer: BC Managed Care – PPO | Admitting: Physical Therapy

## 2019-11-07 DIAGNOSIS — R42 Dizziness and giddiness: Secondary | ICD-10-CM

## 2019-11-07 DIAGNOSIS — R2689 Other abnormalities of gait and mobility: Secondary | ICD-10-CM

## 2019-11-07 DIAGNOSIS — R2681 Unsteadiness on feet: Secondary | ICD-10-CM

## 2019-11-07 NOTE — Therapy (Signed)
[place], [place], [address] Phone: [phone]   Fax:  [phone]  Physical Therapy Treatment  Patient Details  Name: Jason Osborn MRN: [id number] Date of Birth: 12-27-1965 Referring Provider (PT): Feliz Beam, MD   Encounter Date: 11/07/2019   PT End of Session - 11/07/19 1046    Visit Number 7    Number of Visits 12    Date for PT Re-Evaluation 11/26/19    Authorization Type BCBS    PT Start Time 1017    PT Stop Time 1059    PT Time Calculation (min) 42 min    Activity Tolerance Patient tolerated treatment well    Behavior During Therapy [facility] for tasks assessed/performed           Past Medical History:  Diagnosis Date  . Aneurysm, ascending aorta ([place]) 05/19/2014  . Atrial fibrillation (HCC)    POST OPERATIVE  . Atypical chest pain    NEGATIVE ADENOSINE CMR 05/23/17, NORMAL RIGHT AND LEFT HEART CATH AT [facility] ON 12/19/17  . Bicuspid aortic valve   . Cardiac pacemaker in situ   . Dizziness   . Elevated liver function tests   . Essential hypertension   . Fatty liver   . Hypercholesteremia   . Hyperlipidemia   . Hypertension   . Left elbow pain   . Near syncope 05/18/2014  . Neuropathy   . Syncope 05/18/2014   CARDIOINHIBITOR SYNCOPE S/P PPM S/P DISRUPTION OF ATRIAL LEAD FROM ABOVE SURGICAL PROCEDURE S/P LASER LEAD EXTRACTION/VISION 10/11 AT [facility]  . Tendinopathy of elbow   . Thoracic aortic aneurysm (HCC)    S/P 28MM ASCENDING HEMIARCH WITH AV REPAIR AT [facility] ON 12/20/17    Past Surgical History:  Procedure Laterality Date  . KNEE ARTHROSCOPY  2004, 1986  . PACEMAKER INSERTION      There were no vitals filed for this visit.   Subjective Assessment - 11/07/19 1020    Subjective Pt reports he is adjusting to his medication changes; not as much energy and gets tired earlier in the day.    Currently in Pain? Yes    Pain Score 2     Pain Location Leg    Pain Orientation  Right;Left    Pain Descriptors / Indicators Sore    Aggravating Factors  prolonged standing    Pain Relieving Factors nothing              [facility] PT Assessment - 11/07/19 0001      Assessment   Medical Diagnosis demyelinating disease of spinal cord    Referring Provider (PT) Adair Laundry Pardo-Villamizar, MD    Hand Dominance Right    Prior Therapy known to me from prior PT at neuro rehab, has had OP rehab at [facility] Adult PT Treatment/Exercise - 11/07/19 0001      Knee/Hip Exercises: Stretches   Passive Hamstring Stretch Right;Left;2 reps;20 seconds   seated with straight back   Gastroc Stretch Both;2 reps;30 seconds   incline board     Knee/Hip Exercises: Aerobic   Elliptical 3 minutes level 2.0           Balance Exercises - 11/07/19 0001      Balance Exercises: Standing   Standing Eyes Opened Head turns;Foam/compliant surface;3 reps    Standing Eyes Closed Narrow base of support (BOS);Foam/compliant surface;2 reps;10 secs    Tandem Stance Eyes  open;Foam/compliant surface;1 rep;15 secs    SLS Eyes open;1 rep;25 secs    Stepping Strategy Lateral;Foam/compliant surface;10 reps;Posterior   each leg; intermittent UE to steady, occasional CGA    Tandem Gait 2 reps;Forward;Retro    Step Over Hurdles / Cones Lateral step over 10" stool x 10 reps with intermittent UE support to steady    Other Standing Exercises 50 ft gait with speed changes x 8 reps (VC for forward trunk lean and wider base of support when moving to slow speed) - improved with cues and repetition    Other Standing Exercises Comments warrior 2 pose with horiz head turns (slow) each leg forward, 30 sec       Balance Exercises: Seated   Other Seated Exercises seated on dynadisc with opp arm/leg marching  x  20                  PT Long Term Goals - 11/04/19 1244      PT LONG TERM GOAL #1   Title The patient will return demo HEP for conditioning, balance, gaze, and  general mobility.    Time 6    Period Weeks    Status On-going      PT LONG TERM GOAL #2   Title The patient will reduce functional limitations per FOTO improving from 60% limited to < or equal to 48% limited.    Time 6    Period Weeks    Status On-going      PT LONG TERM GOAL #3   Title The patient will improve FGA from 18/30 to > or equal to 24/30 demonstrating improved dynamic stability.    Time 6    Period Weeks    Status On-going      PT LONG TERM GOAL #4   Title The patient will demonstrate 20 minutes of standing functional activity (picking up objects, walking, stairs, standing ex) to demo improved activity tolerance.    Time 6    Period Weeks    Status Partially Met      PT LONG TERM GOAL #5   Title The patient will tolerate gaze x 1 x 1 minutes with dizziness < or equal to 2/10    Baseline 6/10 after 30 seconds    Time 6    Period Weeks    Status On-going                 Plan - 11/07/19 1609    Clinical Impression Statement Pt arrived appearing more fatigued than last session. However by end of session, pt reporting he was more "clear headed".   Initially challenged with gait speed changes demonstrating posterior lean and slight loss of balace Lt laterally. By last 2 trials pt could complete with minimal gait deviations.  Continues to be challenged with Lt horiz head turns during both static positions and dynamic activities.  Pt progressing with FGA activities.    Rehab Potential Good    PT Frequency 2x / week    PT Duration 6 weeks    PT Treatment/Interventions ADLs/Self Care Home Management;Gait training;Stair training;Functional mobility training;Therapeutic activities;Therapeutic exercise;Balance training;Neuromuscular re-education;Patient/family education;Manual techniques;Taping;DME Instruction;Aquatic Therapy    PT Next Visit Plan progress gaze adaptation, establish conditioning program, LE strengthening (functional squats), activity tolerance; continue to  assess need for PT-- is this dec'ing falls or reducing LE heaviness over time?    PT Home Exercise Plan Access Code: [email or web address]    [email or web address] and Agree with Plan of Care Patient  Patient will benefit from skilled therapeutic intervention in order to improve the following deficits and impairments:  Abnormal gait, Dizziness, Decreased endurance, Decreased activity tolerance, Decreased balance, Impaired sensation  Visit Diagnosis: Other abnormalities of gait and mobility  Unsteadiness on feet  Dizziness and giddiness     Problem List Patient Active Problem List   Diagnosis Date Noted  . Neuropathic pain 03/05/2019  . 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 Autonomic dysfunction 04/18/2018  . Cough due to ACE inhibitor 04/18/2018  . Essential hypertension 04/02/2018  . Acute post-operative pain 12/21/2017  . Atrial fibrillation ([place]) 12/21/2017  . Other specified postprocedural states 12/21/2017  . Small fiber neuropathy 05/31/2017  . Hypersomnia 05/30/2017  . Atypical chest pain 04/17/2017  . Bicuspid aortic valve 07/20/2016  . Cervical disc disorder with myelopathy 06/26/2016  . Constipation 08/19/2015  . Dizziness 08/19/2015  . Cardiac pacemaker 11/13/2014  . Abnormal tilt table test 11/06/2014  . History of syncope 11/06/2014  . Mixed hyperlipidemia 11/06/2014  . Symptomatic bradycardia 11/06/2014  . Aneurysm, ascending aorta ([place]) 05/19/2014  . Thoracic aortic aneurysm without rupture ([place]) 05/19/2014  . Near syncope 05/18/2014  . Syncope 05/18/2014    Shelbie Hutching 11/07/2019, [place], [place], [address] Phone: [phone]   Fax:  [phone]  Name: Loomis Anacker MRN: [id number] Date of Birth: 06-13-1965

## 2019-11-11 ENCOUNTER — Ambulatory Visit (INDEPENDENT_AMBULATORY_CARE_PROVIDER_SITE_OTHER): Payer: BC Managed Care – PPO | Admitting: Physical Therapy

## 2019-11-11 ENCOUNTER — Other Ambulatory Visit: Payer: Self-pay

## 2019-11-11 DIAGNOSIS — R2681 Unsteadiness on feet: Secondary | ICD-10-CM | POA: Diagnosis not present

## 2019-11-11 DIAGNOSIS — R29818 Other symptoms and signs involving the nervous system: Secondary | ICD-10-CM

## 2019-11-11 DIAGNOSIS — R2689 Other abnormalities of gait and mobility: Secondary | ICD-10-CM

## 2019-11-11 DIAGNOSIS — R42 Dizziness and giddiness: Secondary | ICD-10-CM

## 2019-11-11 NOTE — Therapy (Signed)
[place], [place], [address] Phone: [phone]   Fax:  [phone]  Physical Therapy Treatment  Patient Details  Name: Jason Osborn MRN: [id number] Date of Birth: May 15, 1965 Referring Provider (PT): Feliz Beam, MD   Encounter Date: 11/11/2019   PT End of Session - 11/11/19 1252    Visit Number 8    Number of Visits 12    Date for PT Re-Evaluation 11/26/19    Authorization Type BCBS    PT Start Time 1147    PT Stop Time 1230    PT Time Calculation (min) 43 min    Activity Tolerance Patient tolerated treatment well    Behavior During Therapy [facility] for tasks assessed/performed           Past Medical History:  Diagnosis Date  . Aneurysm, ascending aorta ([place]) 05/19/2014  . Atrial fibrillation (HCC)    POST OPERATIVE  . Atypical chest pain    NEGATIVE ADENOSINE CMR 05/23/17, NORMAL RIGHT AND LEFT HEART CATH AT [facility] ON 12/19/17  . Bicuspid aortic valve   . Cardiac pacemaker in situ   . Dizziness   . Elevated liver function tests   . Essential hypertension   . Fatty liver   . Hypercholesteremia   . Hyperlipidemia   . Hypertension   . Left elbow pain   . Near syncope 05/18/2014  . Neuropathy   . Syncope 05/18/2014   CARDIOINHIBITOR SYNCOPE S/P PPM S/P DISRUPTION OF ATRIAL LEAD FROM ABOVE SURGICAL PROCEDURE S/P LASER LEAD EXTRACTION/VISION 10/11 AT [facility]  . Tendinopathy of elbow   . Thoracic aortic aneurysm (HCC)    S/P 28MM ASCENDING HEMIARCH WITH AV REPAIR AT [facility] ON 12/20/17    Past Surgical History:  Procedure Laterality Date  . KNEE ARTHROSCOPY  2004, 1986  . PACEMAKER INSERTION      There were no vitals filed for this visit.   Subjective Assessment - 11/11/19 1255    Subjective Pt reports he is less fuzzy headed (from medication) due to the time of day.  Continued fatigue in LEs.    Patient Stated Goals Instability and fatigue are 2 largest complaints. His goals are  improved stability, reducing fatigue.    Currently in Pain? Yes    Pain Score 2     Pain Location Foot    Pain Orientation Right;Left    Pain Descriptors / Indicators Sore    Aggravating Factors  prolonged standing    Pain Relieving Factors ??              [facility] PT Assessment - 11/11/19 0001      Assessment   Medical Diagnosis demyelinating disease of spinal cord    Referring Provider (PT) Adair Laundry Pardo-Villamizar, MD    Hand Dominance Right    Prior Therapy known to me from prior PT at neuro rehab, has had OP [facility]hab at [facility]      Functional Gait  Assessment   Gait assessed  Yes    Gait Level Surface Walks 20 ft in less than 5.5 sec, no assistive devices, good speed, no evidence for imbalance, normal gait pattern, deviates no more than 6 in outside of the 12 in walkway width.   4.55 sec   Change in Gait Speed Able to change speed, demonstrates mild gait deviations, deviates 6-10 in outside of the 12 in walkway width, or no gait deviations, unable to achieve a major change in velocity, or uses a  change in velocity, or uses an assistive device.    Gait with Horizontal Head Turns Performs head turns with moderate changes in gait velocity, slows down, deviates 10-15 in outside 12 in walkway width but recovers, can continue to walk.    Gait with Vertical Head Turns Performs task with slight change in gait velocity (eg, minor disruption to smooth gait path), deviates 6 - 10 in outside 12 in walkway width or uses assistive device    Gait and Pivot Turn Pivot turns safely within 3 sec and stops quickly with no loss of balance.    Step Over Obstacle Is able to step over 2 stacked shoe boxes taped together (9 in total height) without changing gait speed. No evidence of imbalance.    Gait with Narrow Base of Support Is able to ambulate for 10 steps heel to toe with no staggering.    Gait with Eyes Closed Walks 20 ft, no assistive devices, good speed, no evidence of imbalance,  normal gait pattern, deviates no more than 6 in outside 12 in walkway width. Ambulates 20 ft in less than 7 sec.   5.99 sec   Ambulating Backwards Walks 20 ft, no assistive devices, good speed, no evidence for imbalance, normal gait    Steps Alternating feet, no rail.    Total Score 26    FGA comment: 26/30            [facility] Adult PT Treatment/Exercise - 11/11/19 0001      Knee/Hip Exercises: Stretches   Passive Hamstring Stretch Right;Left;2 reps;20 seconds   seated with straight back   Quad Stretch Right;Left;20 seconds;2 reps    Piriformis Stretch Right;Left;1 rep;20 seconds    Gastroc Stretch Both;2 reps;30 seconds   incline board     Knee/Hip Exercises: Standing   Heel Raises Both;Right;1 set;10 reps    Heel Raises Limitations (bilat heel raise, eccentric lowering with RLE)    SLS Rt/Lt on upside down bosu x 30 sec without UE support           Balance Exercises - 11/11/19 0001      Balance Exercises: Standing   SLS Eyes open;Foam/compliant surface;2 reps;15 secs    Rockerboard Anterior/posterior;EO;30 seconds   1/2 foam roller, round side down   Gait with Head Turns Forward;4 reps   vertical head turns.    Tandem Gait Forward;3 reps    Step Over Hurdles / Cones forward gait with stepping over 9" obstacle x 5 reps     Other Standing Exercises stairs, no UE support x 13 down/ up;  standing on mini trampoline - holding 4# ball and completing circles each direction x 3, eyes and head following ball; 50 ft gait with speed changes x 4 reps, improved with cues    Other Standing Exercises Comments 20 ft forward with eyes closed x 5 reps                   PT Long Term Goals - 11/11/19 1244      PT LONG TERM GOAL #1   Title The patient will return demo HEP for conditioning, balance, gaze, and general mobility.    Time 6    Period Weeks    Status On-going      PT LONG TERM GOAL #2   Title The patient will reduce functional limitations per FOTO improving from 60%  limited to < or equal to 48% limited.    Time 6    Period Weeks  Status On-going      PT LONG TERM GOAL #3   Title The patient will improve FGA from 18/30 to > or equal to 24/30 demonstrating improved dynamic stability.    Time 6    Period Weeks    Status Achieved      PT LONG TERM GOAL #4   Title The patient will demonstrate 20 minutes of standing functional activity (picking up objects, walking, stairs, standing ex) to demo improved activity tolerance.    Time 6    Period Weeks    Status Achieved      PT LONG TERM GOAL #5   Title The patient will tolerate gaze x 1 x 1 minutes with dizziness < or equal to 2/10    Baseline 6/10 after 30 seconds    Time 6    Period Weeks    Status On-going                 Plan - 11/11/19 1246    Clinical Impression Statement Pt demonstrating improved FGA; 26/30; has met LTG#3. Pt reported increased foot pain with heel raises and some nausea with eyes following ball moved in circles (pt holds ball, moving arms in circular motion).  Nausea resolved with seated rest, pain in feet remained unchanged with rest.   Pt has met LTG #4.    Rehab Potential Good    PT Frequency 2x / week    PT Duration 6 weeks    PT Treatment/Interventions ADLs/Self Care Home Management;Gait training;Stair training;Functional mobility training;Therapeutic activities;Therapeutic exercise;Balance training;Neuromuscular re-education;Patient/family education;Manual techniques;Taping;DME Instruction;Aquatic Therapy    PT Next Visit Plan progress gaze adaptation, assess need for additional session, FOTO.    PT Home Exercise Plan Access Code: [email or web address]    [email or web address] and Agree with Plan of Care Patient           Patient will benefit from skilled therapeutic intervention in order to improve the following deficits and impairments:  Abnormal gait, Dizziness, Decreased endurance, Decreased activity tolerance, Decreased balance, Impaired sensation  Visit Diagnosis: Other  abnormalities of gait and mobility  Unsteadiness on feet  Dizziness and giddiness  Other symptoms and signs involving the nervous system     Problem List Patient Active Problem List   Diagnosis Date Noted  . Neuropathic pain 03/05/2019  . 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 Autonomic dysfunction 04/18/2018  . Cough due to ACE inhibitor 04/18/2018  . Essential hypertension 04/02/2018  . Acute post-operative pain 12/21/2017  . Atrial fibrillation ([place]) 12/21/2017  . Other specified postprocedural states 12/21/2017  . Small fiber neuropathy 05/31/2017  . Hypersomnia 05/30/2017  . Atypical chest pain 04/17/2017  . Bicuspid aortic valve 07/20/2016  . Cervical disc disorder with myelopathy 06/26/2016  . Constipation 08/19/2015  . Dizziness 08/19/2015  . Cardiac pacemaker 11/13/2014  . Abnormal tilt table test 11/06/2014  . History of syncope 11/06/2014  . Mixed hyperlipidemia 11/06/2014  . Symptomatic bradycardia 11/06/2014  . Aneurysm, ascending aorta ([place]) 05/19/2014  . Thoracic aortic aneurysm without rupture ([place]) 05/19/2014  . Near syncope 05/18/2014  . Syncope 05/18/2014   Kerin Perna, PTA 11/11/19 1:06 PM  [facility] [place], [place], [address] Phone: [phone]   Fax:  [phone]  Name: Jason Osborn MRN: [id number] Date of Birth: [birth year]

## 2019-11-13 ENCOUNTER — Ambulatory Visit (INDEPENDENT_AMBULATORY_CARE_PROVIDER_SITE_OTHER): Payer: BC Managed Care – PPO | Admitting: Rehabilitative and Restorative Service Providers"

## 2019-11-13 ENCOUNTER — Other Ambulatory Visit: Payer: Self-pay

## 2019-11-13 ENCOUNTER — Encounter: Payer: Self-pay | Admitting: Rehabilitative and Restorative Service Providers"

## 2019-11-13 DIAGNOSIS — R42 Dizziness and giddiness: Secondary | ICD-10-CM | POA: Diagnosis not present

## 2019-11-13 DIAGNOSIS — R2689 Other abnormalities of gait and mobility: Secondary | ICD-10-CM | POA: Diagnosis not present

## 2019-11-13 DIAGNOSIS — R2681 Unsteadiness on feet: Secondary | ICD-10-CM | POA: Diagnosis not present

## 2019-11-13 DIAGNOSIS — R29818 Other symptoms and signs involving the nervous system: Secondary | ICD-10-CM

## 2019-11-13 NOTE — Patient Instructions (Signed)
Access Code: [email or web address] URL: [email or web address] Date: 11/10/2019 Prepared by: Margretta Ditty  Exercises Sidelying TFL Stretch - 2 x daily - 7 x weekly - 1 sets - 3 reps - 30 seconds hold Supine ITB Stretch with Strap - 2 x daily - 7 x weekly - 1 sets - 3 reps - 30 seconds hold Straight Leg Raise with External Rotation - 2 x daily - 7 x weekly - 1 sets - 10 reps Marching Bridge - 2 x daily - 7 x weekly - 1 sets - 10 reps Seated Piriformis Stretch with Trunk Bend - 2 x daily - 7 x weekly - 1 sets - 3 reps - 30 seconds hold Seated Hamstring Stretch with Chair - 2 x daily - 7 x weekly - 1 sets - 2 reps - 30 seconds hold Single Leg Heel Raise with Counter Support - 2 x daily - 7 x weekly - 1 sets - 15 reps Sideways Walking - 2 x daily - 7 x weekly - 1 sets - 10 reps

## 2019-11-13 NOTE — Progress Notes (Signed)
Cardiology Office Note   Date:  11/14/2019   ID:  Jason Osborn, DOB 1965-03-26, MRN [id number]  PCP:  Jason Presto, MD  Cardiologist:   Jason Pates, MD   Pt presents for follow up of dizziness     History of Present Illness: Jason Osborn is a 54 y.o. male with a history of bicuspid aortic valve, thoracic aortic aneurysm.  He is s/p repair at [facility] in October 2019 Post op had atrial fibrillation  On amiodarone for a short time    Th pt had an episode of dizziness and syncope since 3/16  Sitting in church had syncope  Lasted seconds.    Tilt table showed heart block  Underwent PPM in Sept 2016   He underwent lead revision in October 2019.   Since then he has continue to have intermitt dziiness   He has been seen at multiple institutions  (including [facility] (Programmer, applications)   The pt has followed the [place] Protocol for POTS with rowing   Found no real help with this.  Note that orthostatic checks in past not always positive The pt  was seen in [facility] in March 2020 with near syncopal spell   Occurred at church while kneeling.   Pt warm, diaphroretic, dizzy   Passed out   First full syncope   He was orthostatic at time  Told to drink more fluids  Few days later similar spell after shower   Seen by cardiol Jason Osborn) in ED who recomm hydration    I sawa the pt in Aug 2020   He was continuinuing to have spells of dizziness   1 to 2x per week   Felt drained after   After a couple beers though he says he felt OK         I saw the pt in July 2020   I recommended he contact the  neurologist at [facility] diagnosed the neuropathy.   I did not make any changes in meds     Since seen he has not been back to [facility]    He was seen at [facility] in neuro   Recomm PT which he is doing     He does not think it is helping  He also was seen at [facility] for pacer f/u   He does not want to go back there   Wants to consolidate care here  The pt still has dizziness    He says he is  drinking fluids   Does some PT  Has a very difficult time from 3 to 5 each afternoon   (washed out, weak)  No frank syncope   Feels tired  Goes to bed at 9      Current Meds  Medication Sig  . amLODipine (NORVASC) 5 MG tablet Take 5 mg by mouth daily.  Marland Kitchen aspirin EC 81 MG tablet Take 81 mg by mouth daily.  . pregabalin (LYRICA) 100 MG capsule Take 100 mg by mouth 3 (three) times daily.  . rosuvastatin (CRESTOR) 10 MG tablet Take 20 mg by mouth daily.   Marland Kitchen venlafaxine XR (EFFEXOR-XR) 75 MG 24 hr capsule Take 50 mg by mouth. Patient reports he is weaning off of this medication     Allergies:   Amantadine, Atenolol, Atorvastatin, Pyridostigmine, Simvastatin, and Amantadines   Past Medical History:  Diagnosis Date  . Aneurysm, ascending aorta (HCC) 05/19/2014  . Atrial fibrillation (HCC)    POST OPERATIVE  . Atypical  chest pain    NEGATIVE ADENOSINE CMR 05/23/17, NORMAL RIGHT AND LEFT HEART CATH AT [facility] ON 12/19/17  . Bicuspid aortic valve   . Cardiac pacemaker in situ   . Dizziness   . Elevated liver function tests   . Essential hypertension   . Fatty liver   . Hypercholesteremia   . Hyperlipidemia   . Hypertension   . Left elbow pain   . Near syncope 05/18/2014  . Neuropathy   . Syncope 05/18/2014   CARDIOINHIBITOR SYNCOPE S/P PPM S/P DISRUPTION OF ATRIAL LEAD FROM ABOVE SURGICAL PROCEDURE S/P LASER LEAD EXTRACTION/VISION 10/11 AT [facility]  . Tendinopathy of elbow   . Thoracic aortic aneurysm (HCC)    S/P ASCENDING HEMIARCH WITH AV REPAIR AT [facility] ON 12/20/17    Past Surgical History:  Procedure Laterality Date  . KNEE ARTHROSCOPY  2004, 1986  . PACEMAKER INSERTION       Social History:  The patient  reports that he has never smoked. He has never used smokeless tobacco. He reports current alcohol use. He reports that he does not use drugs.   Family History:  The patient's family history includes Healthy in his mother.    ROS:  Please see the history of present illness. All  other systems are reviewed and  Negative to the above problem except as noted.    PHYSICAL EXAM: VS:  BP 132/70   Pulse 71   Ht 6\' 1"  (1.854 m)   Wt 238 lb (108 kg)   SpO2 96%   BMI 31.40 kg/m     GEN:  Obese 54 yo in no acute distress  HEENT: normal  Neck: no JVD, carotid bruits Cardiac: RRR; no murmur ,no edema  Respiratory:  clear to auscultation bilaterally, normal work of breathing GI: soft, nontender, nondistended, + BS  No hepatomegaly  MS: no deformity Moving all extremities   Skin: warm and dry, no rash Neuro:  Strength and sensation are intact Psych: euthymic mood, full affect   EKG:  EKG is  ordered today.  SR 71 bpm First degree AV block  PR 212 msec  REmote check of pacer:   No aarrhythmias detected     Lipid Panel No results found for: CHOL, TRIG, HDL, CHOLHDL, VLDL, LDLCALC, LDLDIRECT    Wt Readings from Last 3 Encounters:  11/14/19 238 lb (108 kg)  06/09/19 230 lb (104.3 kg)  10/14/18 227 lb 3.2 oz (103.1 kg)    Cardiac MRI 10/11/18    1. The left ventricle is normal in cavity size. There is mild concentric LV hypertrophy. Global systolic  function is hyperdynamic with an LV ejection fraction calculated at 76%. There are no regional wall motion  abnormalities.    2. The right ventricle is normal in cavity size, wall thickness, and systolic function. A RV lead is seen  ending at the apical septum.    3. Both atria are normal in size. An RA lead is seen.    4. The aortic valve is bicuspid in morphology. The patient is status post aortic valve repair (raphe  shaving/debridement of conjoint cusp). There is no significant aortic stenosis. The peak aortic valve area  by planimetry is 4.1 cm2. Peak flow velocity at the aortic valve is 1.9 m/s. There is a central jet of mild  aortic regurgitation. No other significant valvular disease seen.    5. Delayed enhancement MRI for viability is normal. There is no evidence of myocardial  infarction,  scarring, or infiltration.  6. No LV thrombus seen.      Thoracic MRA:    1. The patient is status post ascending aortic and hemiarch replacement. The aortic root is top-normal in  size. There is no extravasation of contrast or aortic dissection seen. The tubular ascending aorta, aortic  arch and descending thoracic aorta are normal in diameter.     Bi-orthogonal luminal aortic dimensions are listed below:    Sinuses of Valsalva: 4.0 x 3.5 cm  Sinotubular junction: 2.8 x 2.7 cm  Asc. aorta (at level of PA bifurcation): 3.0 x 3.0 cm   Prox. arch (prior to the right innominate artery): 2.9 x 2.9 cm  Transverse arch: 2.6 x 2.5 cm   Descending thoracic aorta (PA): 2.6 x 2.4 cm (2.4 x 2.3 cm)  Descending aorta (diaphragm): 2.4 x 2.3 cm (2.3 x 2.2 cm)       2. The aortic arch is left sided. There is normal branching of the arch vessels.     3. The main and proximal branch pulmonary arteries are normal in size.     4. There are normal systemic and pulmonary venous connections.     Tilt table 08/19/15  Vasovagal syncope 08/19/2015  10/09/2014 Tilt Table ([facility]): The patient was monitored at baseline in the supine position and then tilted to a 70 degree angle with continuous monitoring. After being returned to the supine position the patient was observed and monitored. After six minutes, the patient began to have intermittent heart block for a few beats, followed by 8 seconds of complete heart block with no escape. He also lost consciousness with that, and the table was lowered to supine, at which NSR with normal conduction resumed. There were no apparent complications. Post Procedure Rhythm:Sinus rhythm Interpretation: Vasovagal syncope with a strong cardioinhibitory component - 8 seconds of complete heart block. The image has been saved to a "documentation only" encounter today, as I can't figure out how to insert the picture into this  note. I discussed the finding with him and his wife.Although no treatment is guaranteed to completely resolve syncope, a PPM implant would be very likely to at least provid     ASSESSMENT AND PLAN:  1  Dizziness/syncope   Pt has had a LONG hx of symptoms   He has a PPM placed for heart block demonstrated on tilt table    Still with symptoms   [facility], [facility]   The orthostatics were negative when I saw him 1 year ago  When I saw him in 2020 I thought about changing meds since he was on a vasodlator but then I encouraged him to re[facility] to [facility] In the interval his symptoms continue  INteresting, they peak about 3 to 5 pm  He feels better before this   Not sure how beer fits in Also interesting is degree of pacing  I will review with Odessa FlemingS Klein.  He is set to see today for pacer f/u (establish)   Consider another med instead of amlodipine     2  Hx bicuspid AV with  Aneurysm  Pt s/p AVR with aortic  Replacement   2019  [facility]  3  Hx small fiber neuropathy.  Dx [facility] at [facility]     4  Hx HTN  BP is good today at rest  Again, orthostatics deferred     Current medicines are reviewed at length with the patient  today.  The patient does not have concerns regarding medicines.  Signed, Jason Pates, MD  11/14/2019 8:33 AM    [facility] [address] [place], [place], [place]  [address] Phone: [phone]; Fax: [phone]

## 2019-11-13 NOTE — Therapy (Signed)
[facility] [place] [address] [place], [place], [address] Phone: [phone]   Fax:  [phone]  Physical Therapy Treatment and Discharge Summary  Patient Details  Name: Jason Osborn MRN: [id number] Date of Birth: 26-Apr-1965 Referring Provider (PT): Feliz Beam, MD   Encounter Date: 11/13/2019   PT End of Session - 11/13/19 1539    Visit Number 9    Number of Visits 12    Date for PT Re-Evaluation 11/26/19    Authorization Type BCBS    PT Start Time 1532    PT Stop Time 1610    PT Time Calculation (min) 38 min    Activity Tolerance Patient tolerated treatment well    Behavior During Therapy [facility] for tasks assessed/performed           Past Medical History:  Diagnosis Date  . Aneurysm, ascending aorta ([place]) 05/19/2014  . Atrial fibrillation (HCC)    POST OPERATIVE  . Atypical chest pain    NEGATIVE ADENOSINE CMR 05/23/17, NORMAL RIGHT AND LEFT HEART CATH AT [facility] ON 12/19/17  . Bicuspid aortic valve   . Cardiac pacemaker in situ   . Dizziness   . Elevated liver function tests   . Essential hypertension   . Fatty liver   . Hypercholesteremia   . Hyperlipidemia   . Hypertension   . Left elbow pain   . Near syncope 05/18/2014  . Neuropathy   . Syncope 05/18/2014   CARDIOINHIBITOR SYNCOPE S/P PPM S/P DISRUPTION OF ATRIAL LEAD FROM ABOVE SURGICAL PROCEDURE S/P LASER LEAD EXTRACTION/VISION 10/11 AT [facility]  . Tendinopathy of elbow   . Thoracic aortic aneurysm (HCC)    S/P 28MM ASCENDING HEMIARCH WITH AV REPAIR AT [facility] ON 12/20/17    Past Surgical History:  Procedure Laterality Date  . KNEE ARTHROSCOPY  2004, 1986  . PACEMAKER INSERTION      There were no vitals filed for this visit.   Subjective Assessment - 11/13/19 1537    Subjective The patient reports this time of day is hard for him (usually between 3 and 5pm).  He reports legs more "rubbery" and brain fog.  He notices increased awareness in  posture/balance.    Patient Stated Goals Instability and fatigue are 2 largest complaints. His goals are improved stability, reducing fatigue.    Currently in Pain? Yes    Pain Score 4     Pain Location Foot    Pain Orientation Right;Left    Pain Descriptors / Indicators Sore    Pain Type Chronic pain    Pain Onset More than a month ago    Pain Frequency Constant    Aggravating Factors  prolonged standing    Pain Relieving Factors unsure                             [facility] Adult PT Treatment/Exercise - 11/13/19 1541      Self-Care   Self-Care Other Self-Care Comments    Other Self-Care Comments  PT and patient discussed current status.  He has variability in his performance day to day.  He can spend hours outdoors carrying landscaping rocks and doing yard work one day and feel weak and fall the next day.      Neuro Re-ed    Neuro Re-ed Details  Standing single leg stance, clock reaches in single leg stance.  Standing marching activities with slow/controlled movement.  Toe walking, heel walking.  Gaze  stabilization exercise in sitting.      Exercises   Exercises Knee/Hip      Lumbar Exercises: Stretches   Gastroc Stretch 1 rep;60 seconds      Knee/Hip Exercises: Aerobic   Elliptical 2 minutes level 1.0      Knee/Hip Exercises: Standing   Functional Squat 10 reps    Functional Squat Limitations with chair touch           Vestibular Treatment/Exercise - 11/13/19 1541      Vestibular Treatment/Exercise   Vestibular Treatment Provided Gaze    Gaze Exercises X1 Viewing Horizontal      X1 Viewing Horizontal   Foot Position seated    Comments tolerates 25 seconds before getting blurriness and 8/10 dizziness.                 PT Education - 11/13/19 1757    Education Details HEP updated    Person(s) Educated Patient    Methods Explanation;Demonstration;Handout    Comprehension Verbalized understanding;Returned demonstration                PT Long Term Goals - 11/13/19 1539      PT LONG TERM GOAL #1   Title The patient will return demo HEP for conditioning, balance, gaze, and general mobility.    Time 6    Period Weeks    Status Achieved      PT LONG TERM GOAL #2   Title The patient will reduce functional limitations per FOTO improving from 60% limited to < or equal to 48% limited.    Baseline Functional limitation of 38% noted at d/c.  *The patient answers FOTO questions as difficulty with walking between rooms, but no difficulty climbing several flights of stairs.  Some responses vary and may be inconsistent due to his reports of variability in status day to day.    Time 6    Period Weeks    Status Achieved      PT LONG TERM GOAL #3   Title The patient will improve FGA from 18/30 to > or equal to 24/30 demonstrating improved dynamic stability.    Time 6    Period Weeks    Status Achieved      PT LONG TERM GOAL #4   Title The patient will demonstrate 20 minutes of standing functional activity (picking up objects, walking, stairs, standing ex) to demo improved activity tolerance.    Time 6    Period Weeks    Status Achieved      PT LONG TERM GOAL #5   Title The patient will tolerate gaze x 1 x 1 minutes with dizziness < or equal to 2/10    Baseline 6/10 after 30 seconds at eval; worse today with8/10 symptoms after 20 seconds.    Time 6    Period Weeks    Status Not Met                 Plan - 11/13/19 1757    Clinical Impression Statement The patient has partially met LTGs, although does not subjectively report any functional change.  He continues with variable symptoms.  PT to d/c patient with HEP.    Rehab Potential Good    PT Frequency 2x / week    PT Duration 6 weeks    PT Treatment/Interventions ADLs/Self Care Home Management;Gait training;Stair training;Functional mobility training;Therapeutic activities;Therapeutic exercise;Balance training;Neuromuscular re-education;Patient/family education;Manual  techniques;Taping;DME Instruction;Aquatic Therapy    PT Next Visit Plan discharge  PT Home Exercise Plan Access Code: [email or web address]    [email or web address] and Agree with Plan of Care Patient           Patient will benefit from skilled therapeutic intervention in order to improve the following deficits and impairments:  Abnormal gait, Dizziness, Decreased endurance, Decreased activity tolerance, Decreased balance, Impaired sensation  Visit Diagnosis: Other abnormalities of gait and mobility  Unsteadiness on feet  Dizziness and giddiness  Other symptoms and signs involving the nervous system    PHYSICAL THERAPY DISCHARGE SUMMARY  Visits from Start of Care: 9   Current functional level related to goals / functional outcomes: See goals above   Remaining deficits: Home program.    Education / Equipment: Home program  Plan: Patient agrees to discharge.  Patient goals were partially met. Patient is being discharged due to meeting the stated rehab goals.  ?????          Thank you for the referral of this patient. Rudell Cobb, MPT  [place] , PT 11/13/2019, [place] [address] [place], [place], [address] Phone: [phone]   Fax:  [phone]  Name: Jason Osborn MRN: [id number] Date of Birth: Feb 15, 1966

## 2019-11-14 ENCOUNTER — Ambulatory Visit (INDEPENDENT_AMBULATORY_CARE_PROVIDER_SITE_OTHER): Payer: BC Managed Care – PPO | Admitting: Internal Medicine

## 2019-11-14 ENCOUNTER — Encounter: Payer: Self-pay | Admitting: Internal Medicine

## 2019-11-14 ENCOUNTER — Telehealth: Payer: Self-pay | Admitting: Radiology

## 2019-11-14 ENCOUNTER — Ambulatory Visit: Payer: BC Managed Care – PPO | Admitting: Internal Medicine

## 2019-11-14 VITALS — BP 132/70 | HR 71 | Ht 73.0 in | Wt 238.0 lb

## 2019-11-14 VITALS — BP 138/76 | HR 86 | Ht 73.0 in | Wt 237.6 lb

## 2019-11-14 DIAGNOSIS — R55 Syncope and collapse: Secondary | ICD-10-CM

## 2019-11-14 DIAGNOSIS — I10 Essential (primary) hypertension: Secondary | ICD-10-CM | POA: Diagnosis not present

## 2019-11-14 LAB — CUP PACEART INCLINIC DEVICE CHECK
Date Time Interrogation Session: 20210903181103
Implantable Lead Implant Date: 20160901
Implantable Lead Implant Date: 20160901
Implantable Lead Location: 753859
Implantable Lead Location: 753860
Implantable Lead Model: 377
Implantable Lead Model: 377
Implantable Lead Serial Number: 49234000
Implantable Lead Serial Number: 49288022
Implantable Pulse Generator Implant Date: 20160901
Lead Channel Impedance Value: 507 Ohm
Lead Channel Impedance Value: 585 Ohm
Lead Channel Pacing Threshold Amplitude: 1.2 V
Lead Channel Pacing Threshold Amplitude: 1.2 V
Lead Channel Pacing Threshold Amplitude: 1.2 V
Lead Channel Pacing Threshold Amplitude: 1.8 V
Lead Channel Pacing Threshold Amplitude: 1.8 V
Lead Channel Pacing Threshold Amplitude: 1.9 V
Lead Channel Pacing Threshold Pulse Width: 0.4 ms
Lead Channel Pacing Threshold Pulse Width: 0.4 ms
Lead Channel Pacing Threshold Pulse Width: 0.4 ms
Lead Channel Pacing Threshold Pulse Width: 0.4 ms
Lead Channel Pacing Threshold Pulse Width: 0.4 ms
Lead Channel Pacing Threshold Pulse Width: 0.4 ms
Lead Channel Sensing Intrinsic Amplitude: 8.1 mV
Lead Channel Sensing Intrinsic Amplitude: 8.2 mV
Lead Channel Sensing Intrinsic Amplitude: 8.8 mV
Lead Channel Sensing Intrinsic Amplitude: 8.9 mV
Lead Channel Setting Pacing Amplitude: 2.2 V
Lead Channel Setting Pacing Amplitude: 3 V
Lead Channel Setting Pacing Pulse Width: 0.4 ms
Pulse Gen Model: 394929
Pulse Gen Serial Number: 68576339

## 2019-11-14 NOTE — Patient Instructions (Signed)
Medication Instructions:  Your physician recommends that you continue on your current medications as directed. Please refer to the Current Medication list given to you today.  *If you need a refill on your cardiac medications before your next appointment, please call your pharmacy*   Lab Work: none If you have labs (blood work) drawn today and your tests are completely normal, you will receive your results only by: Marland Kitchen MyChart Message (if you have MyChart) OR . A paper copy in the mail If you have any lab test that is abnormal or we need to change your treatment, we will call you to review the results.   Testing/Procedures:  [employer] Cardiac Event Monitor Instructions Your physician has requested you wear your cardiac event monitor for 30 days.  [employer] may call or text to confirm a shipping address. The monitor will be sent to a land address via UPS. [employer] will not ship a monitor to a PO BOX. It typically takes 3-5 days to receive your monitor after it has been enrolled. [employer] will assist with USPS tracking if your package is delayed. The telephone number for [employer] is [phone]. Once you have received your monitor, please review the enclosed instructions. Instruction tutorials can also be viewed under help and settings on the enclosed cell phone. Your monitor has already been registered assigning a specific monitor serial # to you.  Applying the monitor Remove cell phone from case and turn it on. The cell phone works as IT consultant and needs to be within [employer] of you at all times. The cell phone will need to be charged on a daily basis. We recommend you plug the cell phone into the enclosed charger at your bedside table every night.  Monitor batteries: You will receive two monitor batteries labelled #1 and #2. These are your recorders. Plug battery #2 onto the second connection on the enclosed charger. Keep one battery on the charger at all times. This  will keep the monitor battery deactivated. It will also keep it fully charged for when you need to switch your monitor batteries. A small light will be blinking on the battery emblem when it is charging. The light on the battery emblem will remain on when the battery is fully charged.  Open package of a Monitor strip. Insert battery #1 into black hood on strip and gently squeeze monitor battery onto connection as indicated in instruction booklet. Set aside while preparing skin.  Choose location for your strip, vertical or horizontal, as indicated in the instruction booklet. Shave to remove all hair from location. There cannot be any lotions, oils, powders, or colognes on skin where monitor is to be applied. Wipe skin clean with enclosed Saline wipe. Dry skin completely.  Peel paper labeled #1 off the back of the Monitor strip exposing the adhesive. Place the monitor on the chest in the vertical or horizontal position shown in the instruction booklet. One arrow on the monitor strip must be pointing upward. Carefully remove paper labeled #2, attaching remainder of strip to your skin. Try not to create any folds or wrinkles in the strip as you apply it.  Firmly press and release the circle in the center of the monitor battery. You will hear a small beep. This is turning the monitor battery on. The heart emblem on the monitor battery will light up every 5 seconds if the monitor battery in turned on and connected to the patient securely. Do not push and hold the circle down as this turns  the monitor battery off. The cell phone will locate the monitor battery. A screen will appear on the cell phone checking the connection of your monitor strip. This may read poor connection initially but change to good connection within the next minute. Once your monitor accepts the connection you will hear a series of 3 beeps followed by a climbing crescendo of beeps. A screen will appear on the cell phone showing  the two monitor strip placement options. Touch the picture that demonstrates where you applied the monitor strip.  Your monitor strip and battery are waterproof. You are able to shower, bathe, or swim with the monitor on. They just ask you do not submerge deeper than 3 feet underwater. We recommend removing the monitor if you are swimming in a lake, river, or ocean.  Your monitor battery will need to be switched to a fully charged monitor battery approximately once a week. The cell phone will alert you of an action which needs to be made.  On the cell phone, tap for details to reveal connection status, monitor battery status, and cell phone battery status. The green dots indicates your monitor is in good status. A red dot indicates there is something that needs your attention.  To record a symptom, click the circle on the monitor battery. In 30-60 seconds a list of symptoms will appear on the cell phone. Select your symptom and tap save. Your monitor will record a sustained or significant arrhythmia regardless of you clicking the button. Some patients do not feel the heart rhythm irregularities. [employer] will notify us of any serious or critical events.  Refer to instruction booklet for instructions on switching batteries, changing strips, the Do not disturb or Pause features, or any additional questions.  Call [employer] at [phone], to confirm your monitor is transmitting and record your baseline. They will answer any questions you may have regarding the monitor instructions at that time.  Returning the monitor to [employer] Place all equipment back into blue box. Peel off strip of paper to expose adhesive and close box securely. There is a prepaid UPS shipping label on this box. Drop in a UPS drop box, or at a UPS facility like [employer]. You may also contact [employer] to arrange UPS to pick up monitor package at your home.     Follow-Up: At [facility], you and your  health needs are our priority.  As part of our continuing mission to provide you with exceptional heart care, we have created designated Provider Care Teams.  These Care Teams include your primary Cardiologist (physician) and Advanced Practice Providers (APPs -  Physician Assistants and Nurse Practitioners) who all work together to provide you with the care you need, when you need it.  We recommend signing up for the patient portal called "MyChart".  Sign up information is provided on this After Visit Summary.  MyChart is used to connect with patients for Virtual Visits (Telemedicine).  Patients are able to view lab/test results, encounter notes, upcoming appointments, etc.  Non-urgent messages can be sent to your provider as well.   To learn more about what you can do with MyChart, go to [email or web address].    Your next appointment:   1 year(s)  The format for your next appointment:   In Person  Provider:   Sherryl Manges, MD   Other Instructions

## 2019-11-14 NOTE — Progress Notes (Signed)
ELECTROPHYSIOLOGY CONSULT NOTE  Patient ID: Jason Osborn, MRN: [id number], DOB/AGE: 1965/09/05 54 y.o. Admit date: (Not on file) Date of Consult: 11/14/2019  Primary Physician: Vivien Presto, MD Primary Cardiologist: PR     Jason Osborn is a 54 y.o. male who is being seen today for the evaluation of pacemaker  at the request of PR.    HPI Jason Osborn is a 54 y.o. male seen to establish pacemaker follow-up.  He has a 5-year plus history of syncope and lightheadedness.  Tilt table testing demonstrated heart block and he underwent pacemaker implantation 9/16 with a lead revision 10/19 following his aortic aneurysm repair.  His bicuspid aortic valve did not require replacement He is struggled with ongoing dizziness.  He has been seen at multiple institutions including [facility], [facility], [facility].  Has been diagnosed with a small fiber neuropathy.  He has spells about once a month characterized by very abrupt onset of loss of consciousness and resolution of loss of consciousness.  Without warning he finds himself on the ground and is thereafter able to get himself up without difficulty.  Yesterday had an episode where he lost consciousness fell against the garage wall and even before he fell to the ground he was awake and was able to catch himself.  No prodrome.     DATE TEST EF   3/19 Stress MRI  Neg   10/19 LHC (CE)  No obstruc CAD  8/20 cMRI (CE)  76% LGE neg   Date Cr K Hgb  8/21 1.4 4.7       Past Medical History:  Diagnosis Date  . Aneurysm, ascending aorta (HCC) 05/19/2014  . Atrial fibrillation (HCC)    POST OPERATIVE  . Atypical chest pain    NEGATIVE ADENOSINE CMR 05/23/17, NORMAL RIGHT AND LEFT HEART CATH AT [facility] ON 12/19/17  . Bicuspid aortic valve   . Cardiac pacemaker in situ   . Dizziness   . Elevated liver function tests   . Essential hypertension   . Fatty liver   . Hypercholesteremia   . Hyperlipidemia   . Hypertension   . Left elbow pain    . Near syncope 05/18/2014  . Neuropathy   . Syncope 05/18/2014   CARDIOINHIBITOR SYNCOPE S/P PPM S/P DISRUPTION OF ATRIAL LEAD FROM ABOVE SURGICAL PROCEDURE S/P LASER LEAD EXTRACTION/VISION 10/11 AT [facility]  . Tendinopathy of elbow   . Thoracic aortic aneurysm (HCC)    S/P ASCENDING HEMIARCH WITH AV REPAIR AT [facility] ON 12/20/17      Surgical History:  Past Surgical History:  Procedure Laterality Date  . KNEE ARTHROSCOPY  2004, 1986  . PACEMAKER INSERTION       Home Meds: Current Meds  Medication Sig  . amLODipine (NORVASC) 5 MG tablet Take 5 mg by mouth daily.  Marland Kitchen aspirin EC 81 MG tablet Take 81 mg by mouth daily.  . pregabalin (LYRICA) 100 MG capsule Take 100 mg by mouth 3 (three) times daily.  . rosuvastatin (CRESTOR) 10 MG tablet Take 20 mg by mouth daily.   Marland Kitchen venlafaxine XR (EFFEXOR-XR) 75 MG 24 hr capsule Take 50 mg by mouth. Patient reports he is weaning off of this medication    Allergies:  Allergies  Allergen Reactions  . Amantadine Other (See Comments)    Nausea, low blood pressure Nausea, low blood pressure   . Atenolol Other (See Comments)  . Atorvastatin Other (See Comments) and Rash    Frequent urination Other  reaction(s): Other Cerner listed no reactions Body Aches Frequent urination Cerner listed no reactions Muscle aches Body Aches Other reaction(s): Other Cerner listed no reactions Body Aches Frequent urination Frequent urination Cerner listed no reactions Muscle aches Frequent urination Cerner listed no reactions Muscle aches   . Pyridostigmine Other (See Comments) and Rash    Body Aches *able to take in small doses  Body Aches Other reaction(s): Other (see comments) Extremity weakness   . Simvastatin Other (See Comments) and Rash    Body aches Cerner listed no reactions Body aches Increased urination Body aches Cerner listed no reactions Body aches Body aches Body aches Cerner listed no reactions Increased urination Body  aches Cerner listed no reactions Increased urination   . Amantadines     Social History   Socioeconomic History  . Marital status: Married    Spouse name: Not on file  . Number of children: 2  . Years of education: Not on file  . Highest education level: Not on file  Occupational History  . Occupation: Administrator, arts  Tobacco Use  . Smoking status: Never Smoker  . Smokeless tobacco: Never Used  Vaping Use  . Vaping Use: Never used  Substance and Sexual Activity  . Alcohol use: Yes    Comment: weekend use, 2-3 drinks daily  . Drug use: No  . Sexual activity: Not on file  Other Topics Concern  . Not on file  Social History Narrative   Lives with wife, does yard work on the weekends.   Right hand   2 story home   Social Determinants of Health   Financial Resource Strain:   . Difficulty of Paying Living Expenses: Not on file  Food Insecurity:   . Worried About Programme researcher, broadcasting/film/video in the Last Year: Not on file  . Ran Out of Food in the Last Year: Not on file  Transportation Needs:   . Lack of Transportation (Medical): Not on file  . Lack of Transportation (Non-Medical): Not on file  Physical Activity:   . Days of Exercise per Week: Not on file  . Minutes of Exercise per Session: Not on file  Stress:   . Feeling of Stress : Not on file  Social Connections:   . Frequency of Communication with Friends and Family: Not on file  . Frequency of Social Gatherings with Friends and Family: Not on file  . Attends Religious Services: Not on file  . Active Member of Clubs or Organizations: Not on file  . Attends Banker Meetings: Not on file  . Marital Status: Not on file  Intimate Partner Violence:   . Fear of Current or Ex-Partner: Not on file  . Emotionally Abused: Not on file  . Physically Abused: Not on file  . Sexually Abused: Not on file     Family History  Problem Relation Age of Onset  . Healthy Mother      ROS:  Please see the history of  present illness.     All other systems reviewed and negative.    Physical Exam:  Blood pressure 138/76, pulse 86, height 6\' 1"  (1.854 m), weight 237 lb 9.6 oz (107.8 kg), SpO2 96 %. General: Well developed, well nourished male in no acute distress. Head: Normocephalic, atraumatic, sclera non-icteric, no xanthomas, nares are without discharge. EENT: normal  Lymph Nodes:  none Neck: Negative for carotid bruits. JVD not elevated. Back:without scoliosis kyphosis  Lungs: Clear bilaterally to auscultation without wheezes, rales, or rhonchi. Breathing  is unlabored. Device pocket well healed; without hematoma or erythema.  There is no tethering  Heart: RRR with S1 S2. No  murmur . No rubs, or gallops appreciated. Abdomen: Soft, non-tender, non-distended with normoactive bowel sounds. No hepatomegaly. No rebound/guarding. No obvious abdominal masses. Msk:  Strength and tone appear normal for age. Extremities: No clubbing or cyanosis. No edema.  Distal pedal pulses are 2+ and equal bilaterally. Skin: Warm and Dry Neuro: Alert and oriented X 3. CN III-XII intact Grossly normal sensory and motor function . Psych:  Responds to questions appropriately with a normal affect.      Labs: Cardiac Enzymes No results for input(s): CKTOTAL, CKMB, TROPONINI in the last 72 hours. CBC Lab Results  Component Value Date   WBC 6.5 05/19/2014   HGB 13.0 05/19/2014   HCT 38.8 (L) 05/19/2014   MCV 89.0 05/19/2014   PLT 250 05/19/2014   PROTIME: No results for input(s): LABPROT, INR in the last 72 hours. Chemistry No results for input(s): NA, K, CL, CO2, BUN, CREATININE, CALCIUM, PROT, BILITOT, ALKPHOS, ALT, AST, GLUCOSE in the last 168 hours.  Invalid input(s): LABALBU Lipids No results found for: CHOL, HDL, LDLCALC, TRIG BNP No results found for: PROBNP Thyroid Function Tests: No results for input(s): TSH, T4TOTAL, T3FREE, THYROIDAB in the last 72 hours.  Invalid input(s): FREET3 Miscellaneous No  results found for: DDIMER  Radiology/Studies:  No results found.  EKG: sinus @ 86   Assessment and Plan:  Syncope  Dysautonomia  Small fiber neuropathy  Pacemaker-Biotronik  Ascending aortic aneurysm status post repair  Bicuspid aortic valve     The patient has a multitude of issues.  He currently is set by episodes of recurrent syncope about once a month which are abrupt in onset offset sufficiently brief that they are consistent with a bradycardia or a very rapid tachycardia arrhythmia.  Neither should be occurring in the context of a functioning pacemaker.  However, there may be episodes occurring at or below his ventricular sensitivity.  Hence, we will use an event recorder to try to elucidate.  Device function is normal.         Sherryl Manges

## 2019-11-14 NOTE — Telephone Encounter (Signed)
Enrolled patient for a 30 day [employer] Event monitor to be mailed to patients home.  

## 2019-11-14 NOTE — Patient Instructions (Signed)
Medication Instructions:  No changes *If you need a refill on your cardiac medications before your next appointment, please call your pharmacy*   Lab Work: none If you have labs (blood work) drawn today and your tests are completely normal, you will receive your results only by: Marland Kitchen MyChart Message (if you have MyChart) OR . A paper copy in the mail If you have any lab test that is abnormal or we need to change your treatment, we will call you to review the results.   Testing/Procedures: none   Follow-Up:  Other Instructions Plan to return today to see Dr. Graciela Husbands 2:45 pm.

## 2019-11-25 ENCOUNTER — Encounter (INDEPENDENT_AMBULATORY_CARE_PROVIDER_SITE_OTHER): Payer: BC Managed Care – PPO

## 2019-11-25 ENCOUNTER — Encounter: Payer: Self-pay | Admitting: Internal Medicine

## 2019-11-25 DIAGNOSIS — R55 Syncope and collapse: Secondary | ICD-10-CM | POA: Diagnosis not present

## 2019-12-08 ENCOUNTER — Telehealth: Payer: Self-pay | Admitting: Internal Medicine

## 2019-12-08 NOTE — Telephone Encounter (Signed)
Received fax of critical monitor from [employer].   Monitor on 12/08/19 at 11:44 am report states- Sinus rhythm, sinus tachycardia, ventricular tachycardia (14 beats) w/PVCs.  Called patient to ask him what was going on at the time of this report. Patient stated he was exercising at the time as he usually does, and nothing strenuous. Patient stated he did feel weird and felt some skipped beats. Had DOD, Dr. Katrinka Blazing review monitor, NSVT and AIVR, continue to monitor, and no changes. Will forward to Dr. Graciela Husbands to review. Will have report scanned into chart.

## 2019-12-08 NOTE — Telephone Encounter (Signed)
Jason Osborn from [employer] with a critical EKG

## 2019-12-08 NOTE — Telephone Encounter (Signed)
Call disconnected prior to being transferred to RN.

## 2019-12-13 NOTE — Telephone Encounter (Signed)
Will need to print off >> I am not convinced that this was not atrial flutter/fib with aberrant conduction

## 2019-12-17 ENCOUNTER — Ambulatory Visit: Payer: BC Managed Care – PPO | Attending: Medical

## 2019-12-17 ENCOUNTER — Other Ambulatory Visit: Payer: Self-pay

## 2019-12-17 DIAGNOSIS — R2681 Unsteadiness on feet: Secondary | ICD-10-CM | POA: Diagnosis not present

## 2019-12-17 NOTE — Therapy (Signed)
Jason Osborn, [place], [address] Phone: [phone]   Fax:  [phone]  Physical Therapy Evaluation/FCE/ Discharge  Patient Details  Name: Jason Osborn MRN: [id number] Date of Birth: 1965/11/05 Referring Provider (PT): Jason Foots  PA-C   Encounter Date: 12/17/2019   PT End of Session - 12/17/19 1310    Visit Number 1    Number of Visits 1    PT Start Time 0100    PT Stop Time 0436    PT Time Calculation (min) 216 min    Activity Tolerance Patient limited by fatigue    Behavior During Therapy [facility] for tasks assessed/performed           Past Medical History:  Diagnosis Date  . Aneurysm, ascending aorta ([place]) 05/19/2014  . Atrial fibrillation (HCC)    POST OPERATIVE  . Atypical chest pain    NEGATIVE ADENOSINE CMR 05/23/17, NORMAL RIGHT AND LEFT HEART CATH AT [facility] ON 12/19/17  . Bicuspid aortic valve   . Cardiac pacemaker in situ   . Dizziness   . Elevated liver function tests   . Essential hypertension   . Fatty liver   . Hypercholesteremia   . Hyperlipidemia   . Hypertension   . Left elbow pain   . Near syncope 05/18/2014  . Neuropathy   . Syncope 05/18/2014   CARDIOINHIBITOR SYNCOPE S/P PPM S/P DISRUPTION OF ATRIAL LEAD FROM ABOVE SURGICAL PROCEDURE S/P LASER LEAD EXTRACTION/VISION 10/11 AT [facility]  . Tendinopathy of elbow   . Thoracic aortic aneurysm (HCC)    S/P 28MM ASCENDING HEMIARCH WITH AV REPAIR AT [facility] ON 12/20/17    Past Surgical History:  Procedure Laterality Date  . KNEE ARTHROSCOPY  2004, 1986  . PACEMAKER INSERTION      There were no vitals filed for this visit.    Subjective Assessment - 12/17/19 1307    Subjective He report neuropathy and dys autonomia and not able to wrk so need disability assessment.    Patient Stated Goals Get disability assessment.              [facility] PT Assessment - 12/17/19 0001      Assessment   Medical Diagnosis demyelinating disease of  spinal cord    Referring Provider (PT) Jason Foots  PA-C    Hand Dominance Right    Prior Therapy Yes PT      Precautions   Precautions Fall      Restrictions   Weight Bearing Restrictions No      Balance Screen   Has the patient fallen in the past 6 months Yes    How many times? 6      Prior Function   Level of Independence Needs assistance with ADLs;Requires assistive device for independence      Cognition   Overall Cognitive Status Within Functional Limits for tasks assessed                      Objective measurements completed on examination: See above findings.               PT Education - 12/17/19 1654    Education Details Exopectaiton of soreness for 2-4 days, Reviewed  briefly the results of the report    Person(s) Educated Patient    Methods Explanation    Comprehension Verbalized understanding               PT Long Term Goals - 11/13/19 1539  PT LONG TERM GOAL #1   Title The patient will return demo HEP for conditioning, balance, gaze, and general mobility.    Time 6    Period Weeks    Status Achieved      PT LONG TERM GOAL #2   Title The patient will reduce functional limitations per FOTO improving from 60% limited to < or equal to 48% limited.    Baseline Functional limitation of 38% noted at d/c.  *The patient answers FOTO questions as difficulty with walking between rooms, but no difficulty climbing several flights of stairs.  Some responses vary and may be inconsistent due to his reports of variability in status day to day.    Time 6    Period Weeks    Status Achieved      PT LONG TERM GOAL #3   Title The patient will improve FGA from 18/30 to > or equal to 24/30 demonstrating improved dynamic stability.    Time 6    Period Weeks    Status Achieved      PT LONG TERM GOAL #4   Title The patient will demonstrate 20 minutes of standing functional activity (picking up objects, walking, stairs, standing ex) to demo  improved activity tolerance.    Time 6    Period Weeks    Status Achieved      PT LONG TERM GOAL #5   Title The patient will tolerate gaze x 1 x 1 minutes with dizziness < or equal to 2/10    Baseline 6/10 after 30 seconds at eval; worse today with8/10 symptoms after 20 seconds.    Time 6    Period Weeks    Status Not Met                  Plan - 12/17/19 1310    Clinical Impression Statement Jason Osborn completed the FCE testing rated at a light level of work but due to self limiting due to dizziness and fatigue this was the minimum ability reather than maximum ability    PT Next Visit Plan Discharge . Will send FCE to Jason Osborn and Agree with Plan of Care Patient           Patient will benefit from skilled therapeutic intervention in order to improve the following deficits and impairments:  Abnormal gait, Dizziness, Decreased endurance, Decreased activity tolerance, Decreased balance, Impaired sensation  Visit Diagnosis: Unsteadiness on feet     Problem List Patient Active Problem List   Diagnosis Date Noted  . Neuropathic pain 03/05/2019  . 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 Autonomic dysfunction 04/18/2018  . Cough due to ACE inhibitor 04/18/2018  . Essential hypertension 04/02/2018  . Acute post-operative pain 12/21/2017  . Atrial fibrillation ([place]) 12/21/2017  . Other specified postprocedural states 12/21/2017  . Small fiber neuropathy 05/31/2017  . Hypersomnia 05/30/2017  . Atypical chest pain 04/17/2017  . Bicuspid aortic valve 07/20/2016  . Cervical disc disorder with myelopathy 06/26/2016  . Constipation 08/19/2015  . Dizziness 08/19/2015  . Cardiac pacemaker 11/13/2014  . Abnormal tilt table test 11/06/2014  . History of syncope 11/06/2014  . Mixed hyperlipidemia 11/06/2014  . Symptomatic bradycardia 11/06/2014  . Aneurysm, ascending aorta ([place]) 05/19/2014  . Thoracic aortic aneurysm without rupture ([place]) 05/19/2014  . Near syncope  05/18/2014  . Syncope 05/18/2014    Darrel Hoover  PT 12/17/2019, 4:59 PM  [place], [place],  [address] Phone: [phone]   Fax:  [phone]  Name: Jason Osborn MRN: [id number] Date of Birth: Oct 01, 1965

## 2020-01-30 ENCOUNTER — Ambulatory Visit: Payer: BC Managed Care – PPO | Admitting: Internal Medicine

## 2020-01-30 ENCOUNTER — Encounter: Payer: Self-pay | Admitting: Internal Medicine

## 2020-01-30 ENCOUNTER — Other Ambulatory Visit: Payer: Self-pay

## 2020-01-30 VITALS — BP 148/82 | HR 86 | Ht 73.0 in | Wt 241.0 lb

## 2020-01-30 DIAGNOSIS — I4891 Unspecified atrial fibrillation: Secondary | ICD-10-CM | POA: Diagnosis not present

## 2020-01-30 DIAGNOSIS — R55 Syncope and collapse: Secondary | ICD-10-CM | POA: Diagnosis not present

## 2020-01-30 DIAGNOSIS — Z950 Presence of cardiac pacemaker: Secondary | ICD-10-CM | POA: Diagnosis not present

## 2020-01-30 DIAGNOSIS — Z87898 Personal history of other specified conditions: Secondary | ICD-10-CM | POA: Diagnosis not present

## 2020-01-30 DIAGNOSIS — G909 Disorder of the autonomic nervous system, unspecified: Secondary | ICD-10-CM | POA: Diagnosis not present

## 2020-01-30 LAB — CUP PACEART INCLINIC DEVICE CHECK
Brady Statistic RA Percent Paced: 15 %
Brady Statistic RV Percent Paced: 0 %
Date Time Interrogation Session: 20211119102600
Implantable Lead Implant Date: 20160901
Implantable Lead Implant Date: 20160901
Implantable Lead Location: 753859
Implantable Lead Location: 753860
Implantable Lead Model: 377
Implantable Lead Model: 377
Implantable Lead Serial Number: 49234000
Implantable Lead Serial Number: 49288022
Implantable Pulse Generator Implant Date: 20160901
Lead Channel Impedance Value: 526 Ohm
Lead Channel Impedance Value: 624 Ohm
Lead Channel Pacing Threshold Amplitude: 1.3 V
Lead Channel Pacing Threshold Amplitude: 1.7 V
Lead Channel Pacing Threshold Pulse Width: 0.4 ms
Lead Channel Pacing Threshold Pulse Width: 0.4 ms
Lead Channel Sensing Intrinsic Amplitude: 8.6 mV
Lead Channel Sensing Intrinsic Amplitude: 9.1 mV
Lead Channel Setting Pacing Amplitude: 2.3 V
Lead Channel Setting Pacing Amplitude: 3 V
Lead Channel Setting Pacing Pulse Width: 0.4 ms
Pulse Gen Model: 394929
Pulse Gen Serial Number: 68576339

## 2020-01-30 NOTE — Progress Notes (Signed)
Patient Care Team: Corrington, Meredith Mody, MD as PCP - General (Family Medicine) Pricilla Riffle, MD as PCP - Cardiology (Cardiology) Glendale Chard, DO as Consulting Physician (Neurology)   HPI  Jason Osborn is a 54 y.o. male seen in follow-up for syncope in the context of a previously implanted [employer]otronik pacemaker.  (9/16) undertaken because of tilt table test positivity  Normal pacemaker function but recurrent syncope.  Event recorder utilized.  Recurrent events.  Demonstrated sinus rhythm.  Heart rates were only about 100 suggesting that CLS was not sufficiently activated (oral)  Palpitations correlated with PVCs  Had asymptomatic nonsustained VT    DATE TEST EF   3/19 Stress MRI  Neg   10/19 LHC (CE)  No obstruc CAD  8/20 cMRI (CE)  76% LGE neg   Date Cr K Hgb  8/21 1.4 4.7        Records and Results Reviewed   Past Medical History:  Diagnosis Date   Aneurysm, ascending aorta (HCC) 05/19/2014   Atrial fibrillation (HCC)    POST OPERATIVE   Atypical chest pain    NEGATIVE ADENOSINE CMR 05/23/17, NORMAL RIGHT AND LEFT HEART CATH AT [facility] ON 12/19/17   Bicuspid aortic valve    Cardiac pacemaker in situ    Dizziness    Elevated liver function tests    Essential hypertension    Fatty liver    Hypercholesteremia    Hyperlipidemia    Hypertension    Left elbow pain    Near syncope 05/18/2014   Neuropathy    Syncope 05/18/2014   CARDIOINHIBITOR SYNCOPE S/P PPM S/P DISRUPTION OF ATRIAL LEAD FROM ABOVE SURGICAL PROCEDURE S/P LASER LEAD EXTRACTION/VISION 10/11 AT [facility]   Tendinopathy of elbow    Thoracic aortic aneurysm (HCC)    S/P ASCENDING HEMIARCH WITH AV REPAIR AT [facility] ON 12/20/17    Past Surgical History:  Procedure Laterality Date   KNEE ARTHROSCOPY  2004, 1986   PACEMAKER INSERTION      Current Meds  Medication Sig   amLODipine (NORVASC) 5 MG tablet Take 5 mg by mouth daily.   aspirin EC 81 MG tablet Take 81 mg by mouth  daily.   nortriptyline (PAMELOR) 50 MG capsule Take 50 mg by mouth at bedtime.   pregabalin (LYRICA) 100 MG capsule Take 100 mg by mouth 3 (three) times daily.   rosuvastatin (CRESTOR) 10 MG tablet Take 20 mg by mouth daily.    venlafaxine (EFFEXOR) 75 MG tablet Take 75 mg by mouth daily.    Allergies  Allergen Reactions   Amantadine Other (See Comments)    Nausea, low blood pressure Nausea, low blood pressure    Atenolol Other (See Comments)   Atorvastatin Other (See Comments) and Rash    Frequent urination Other reaction(s): Other Cerner listed no reactions Body Aches Frequent urination Cerner listed no reactions Muscle aches Body Aches Other reaction(s): Other Cerner listed no reactions Body Aches Frequent urination Frequent urination Cerner listed no reactions Muscle aches Frequent urination Cerner listed no reactions Muscle aches    Pyridostigmine Other (See Comments) and Rash    Body Aches *able to take in small doses  Body Aches Other reaction(s): Other (see comments) Extremity weakness    Simvastatin Other (See Comments) and Rash    Body aches Cerner listed no reactions Body aches Increased urination Body aches Cerner listed no reactions Body aches Body aches Body aches Cerner listed no reactions Increased urination Body aches Cerner  listed no reactions Increased urination    Amantadines       Review of Systems negative except from HPI and PMH  Physical Exam BP (!) 148/82    Pulse 86    Ht 6\' 1"  (1.854 m)    Wt 241 lb (109.3 kg)    SpO2 98%    BMI 31.80 kg/m  Well developed and well nourished in no acute distress HENT normal E scleral and icterus clear Neck Supple JVP flat; carotids brisk and full Clear to ausculation  Regular rate and rhythm, no murmurs gallops or rub Soft with active bowel sounds No clubbing cyanosis Edema Alert and oriented, grossly normal motor and sensory function Skin Warm and Dry  Sinus at  86 Intervals 20/11/36  CrCl cannot be calculated (Patient's most recent lab result is older than the maximum 21 days allowed.).   Assessment and  Plan Syncope  Dysautonomia  Small fiber neuropathy  Pacemaker-Biotronik  Ascending aortic aneurysm status post repair  Bicuspid aortic valve  VT-nonsustained  PVCs   Patient with recurrent syncope associated with normal rhythm.  Border mediated.  Have reprogrammed CLS to increase the heart rate from 120-140 increase since sensitivity for medium--high.  Have discussed the potential untowards of palpitations.  Lengthy discussion regarding the physiology of vasomotor syncope and the role of the CLS pacemaker  As relates to palpitations, will try over-the-counter magnesium.    Current medicines are reviewed at length with the patient today .  The patient does not  have concerns regarding medicines.

## 2020-01-30 NOTE — Patient Instructions (Signed)

## 2020-02-12 ENCOUNTER — Ambulatory Visit (INDEPENDENT_AMBULATORY_CARE_PROVIDER_SITE_OTHER): Payer: BC Managed Care – PPO

## 2020-02-12 DIAGNOSIS — R55 Syncope and collapse: Secondary | ICD-10-CM

## 2020-02-12 LAB — CUP PACEART REMOTE DEVICE CHECK
Battery Remaining Percentage: 60 %
Brady Statistic RA Percent Paced: 23 %
Brady Statistic RV Percent Paced: 0 %
Date Time Interrogation Session: 20211202115525
Implantable Lead Implant Date: 20160901
Implantable Lead Implant Date: 20160901
Implantable Lead Location: 753859
Implantable Lead Location: 753860
Implantable Lead Model: 377
Implantable Lead Model: 377
Implantable Lead Serial Number: 49234000
Implantable Lead Serial Number: 49288022
Implantable Pulse Generator Implant Date: 20160901
Lead Channel Impedance Value: 468 Ohm
Lead Channel Impedance Value: 605 Ohm
Lead Channel Pacing Threshold Amplitude: 1.9 V
Lead Channel Pacing Threshold Pulse Width: 0.4 ms
Lead Channel Sensing Intrinsic Amplitude: 7 mV
Lead Channel Sensing Intrinsic Amplitude: 8.1 mV
Lead Channel Setting Pacing Amplitude: 2.3 V
Lead Channel Setting Pacing Amplitude: 3 V
Lead Channel Setting Pacing Pulse Width: 0.4 ms
Pulse Gen Model: 394929
Pulse Gen Serial Number: 68576339

## 2020-02-19 ENCOUNTER — Telehealth: Payer: Self-pay

## 2020-02-19 NOTE — Telephone Encounter (Addendum)
[employer] alert received 02/19/20 for 2 HVR events logged, longest in duration 1 minute 4 seconds, no EGM available.   Called patient to assess s/s and medication compliance.  No answer, LMOVM.

## 2020-02-20 NOTE — Progress Notes (Signed)
Remote pacemaker transmission.   

## 2020-02-24 NOTE — Telephone Encounter (Signed)
Can we get some help from [employer] to see about egram information aobut the tachycardia  Thanks SK

## 2020-02-24 NOTE — Telephone Encounter (Signed)
[employer] alert received for continued HVR episodes.    Spoke with pt.  He reports he was very active yesterday working in yard.  He was symptomatic at times as he felt lightheaded as if he might pass out and have to sit down.  Then last night while just resting in his chair he felt his heart rate speed up for no reason.  He did not have any other symptoms when that occurred.    Current meds include ASA 81mg .    Reviewed ED precautions with pt.

## 2020-02-25 NOTE — Telephone Encounter (Signed)
I reviewed this with Jason Osborn from [employer].  The device is programmed to only store 8 HVR episodes.  The last EGM stored was 11/30.  In regards to the 13 episodes on 12/13, I did not include the information that the longest episode was 44 seconds.

## 2020-02-29 ENCOUNTER — Emergency Department (HOSPITAL_COMMUNITY): Payer: BC Managed Care – PPO

## 2020-02-29 ENCOUNTER — Encounter (HOSPITAL_COMMUNITY): Payer: Self-pay

## 2020-02-29 ENCOUNTER — Other Ambulatory Visit: Payer: Self-pay

## 2020-02-29 ENCOUNTER — Emergency Department (HOSPITAL_COMMUNITY)
Admission: EM | Admit: 2020-02-29 | Discharge: 2020-02-29 | Disposition: A | Discharge status: Home / Self Care | Payer: BC Managed Care – PPO | Attending: Emergency Medicine | Admitting: Emergency Medicine

## 2020-02-29 DIAGNOSIS — Z79899 Other long term (current) drug therapy: Secondary | ICD-10-CM | POA: Insufficient documentation

## 2020-02-29 DIAGNOSIS — R072 Precordial pain: Secondary | ICD-10-CM | POA: Diagnosis not present

## 2020-02-29 DIAGNOSIS — I10 Essential (primary) hypertension: Secondary | ICD-10-CM | POA: Insufficient documentation

## 2020-02-29 DIAGNOSIS — I4891 Unspecified atrial fibrillation: Secondary | ICD-10-CM | POA: Diagnosis not present

## 2020-02-29 DIAGNOSIS — Z7982 Long term (current) use of aspirin: Secondary | ICD-10-CM | POA: Insufficient documentation

## 2020-02-29 LAB — BASIC METABOLIC PANEL
Anion gap: 12 (ref 5–15)
BUN: 10 mg/dL (ref 6–20)
CO2: 23 mmol/L (ref 22–32)
Calcium: 9.5 mg/dL (ref 8.9–10.3)
Chloride: 106 mmol/L (ref 98–111)
Creatinine, Ser: 1.11 mg/dL (ref 0.61–1.24)
GFR, Estimated: >60 mL/min (ref >60)
Glucose, Bld: 114 mg/dL — ABNORMAL HIGH (ref 70–99)
Potassium: 4.6 mmol/L (ref 3.5–5.1)
Sodium: 141 mmol/L (ref 135–145)

## 2020-02-29 LAB — CBC
HCT: 44.8 % (ref 39.0–52.0)
Hemoglobin: 14.6 g/dL (ref 13.0–17.0)
MCH: 29.6 pg (ref 26.0–34.0)
MCHC: 32.6 g/dL (ref 30.0–36.0)
MCV: 90.9 fL (ref 80.0–100.0)
Platelets: 267 10*3/uL (ref 150–400)
RBC: 4.93 MIL/uL (ref 4.22–5.81)
RDW: 12.1 % (ref 11.5–15.5)
WBC: 6.8 10*3/uL (ref 4.0–10.5)
nRBC: 0 % (ref 0.0–0.2)

## 2020-02-29 LAB — TROPONIN I (HIGH SENSITIVITY)
Troponin I (High Sensitivity): 4 ng/L (ref <18)
Troponin I (High Sensitivity): 3 ng/L (ref <18)

## 2020-02-29 MED ORDER — ASPIRIN 81 MG PO CHEW
324.0000 mg | CHEWABLE_TABLET | Freq: Once | ORAL | Status: AC
  Administered 2020-02-29: 324 mg via ORAL
  Filled 2020-02-29: qty 4

## 2020-02-29 NOTE — ED Notes (Signed)
RN placed call to [employer] rep. Stat interrogation being paged out for pt's pacemaker

## 2020-02-29 NOTE — ED Provider Notes (Addendum)
[facility] EMERGENCY DEPARTMENT Provider Note   CSN: [id number] Arrival date & time: 02/29/20  1258     History Chief Complaint  Patient presents with  . Chest Pain    Jason Osborn is a 54 y.o. male past medical history of ascending thoracic aortic aneurysm status post repair in 2019, hypertension, hyperlipidemia, recurrent syncope w Biotronik pacemaker, postoperative atrial fibrillation, presenting to the emergency department with complaint of chest tightness that began while at rest this morning around 11:30 AM.  He states he was sitting in his recliner watching a movie with his wife when he began feeling a band of tightness across the lower part of his chest.  He states it radiated into his left arm and his arm felt very heavy.  He also felt some heaviness in his left face. He had no facial droop or slurred speech.   Symptoms lasted about 10 minutes in duration, resolved without any intervention.  He has never had symptoms like this before.  Symptoms have not recurred today.   Patient is followed by Dr. Graciela Husbands with cardiology. Per chart review and care everywhere, patient had normal coronary arteries on heart catheterization on 12/19/2017 by [facility].  The history is provided by the patient and medical records.    HPI: A 54 year old patient with a history of hypertension, hypercholesterolemia and obesity presents for evaluation of chest pain. Initial onset of pain was approximately 3-6 hours ago. The patient's chest pain is described as heaviness/pressure/tightness and is not worse with exertion. The patient's chest pain is middle- or left-sided, is not well-localized, is not sharp and does radiate to the arms/jaw/neck. The patient does not complain of nausea and denies diaphoresis. The patient has no history of stroke, has no history of peripheral artery disease, has not smoked in the past 90 days, denies any history of treated diabetes and has no relevant family history of  coronary artery disease (first degree relative at less than age 38).   Past Medical History:  Diagnosis Date  . Aneurysm, ascending aorta (HCC) 05/19/2014  . Atrial fibrillation (HCC)    POST OPERATIVE  . Atypical chest pain    NEGATIVE ADENOSINE CMR 05/23/17, NORMAL RIGHT AND LEFT HEART CATH AT [facility] ON 12/19/17  . Bicuspid aortic valve   . Cardiac pacemaker in situ   . Dizziness   . Elevated liver function tests   . Essential hypertension   . Fatty liver   . Hypercholesteremia   . Hyperlipidemia   . Hypertension   . Left elbow pain   . Near syncope 05/18/2014  . Neuropathy   . Syncope 05/18/2014   CARDIOINHIBITOR SYNCOPE S/P PPM S/P DISRUPTION OF ATRIAL LEAD FROM ABOVE SURGICAL PROCEDURE S/P LASER LEAD EXTRACTION/VISION 10/11 AT [facility]  . Tendinopathy of elbow   . Thoracic aortic aneurysm (HCC)    S/P ASCENDING HEMIARCH WITH AV REPAIR AT [facility] ON 12/20/17    Patient Active Problem List   Diagnosis Date Noted  . Neuropathic pain 03/05/2019  . 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 Autonomic dysfunction 04/18/2018  . Cough due to ACE inhibitor 04/18/2018  . Essential hypertension 04/02/2018  . Acute post-operative pain 12/21/2017  . Atrial fibrillation (HCC) 12/21/2017  . Other specified postprocedural states 12/21/2017  . Small fiber neuropathy 05/31/2017  . Hypersomnia 05/30/2017  . Atypical chest pain 04/17/2017  . Bicuspid aortic valve 07/20/2016  . Cervical disc disorder with myelopathy 06/26/2016  . Constipation 08/19/2015  . Dizziness 08/19/2015  . Cardiac pacemaker 11/13/2014  .  Abnormal tilt table test 11/06/2014  . History of syncope 11/06/2014  . Mixed hyperlipidemia 11/06/2014  . Symptomatic bradycardia 11/06/2014  . Aneurysm, ascending aorta (HCC) 05/19/2014  . Thoracic aortic aneurysm without rupture (HCC) 05/19/2014  . Near syncope 05/18/2014  . Syncope 05/18/2014    Past Surgical History:  Procedure Laterality Date  . KNEE ARTHROSCOPY  2004, 1986  .  PACEMAKER INSERTION         Family History  Problem Relation Age of Onset  . Healthy Mother     Social History   Tobacco Use  . Smoking status: Never Smoker  . Smokeless tobacco: Never Used  Vaping Use  . Vaping Use: Never used  Substance Use Topics  . Alcohol use: Yes    Comment: weekend use, 2-3 drinks daily  . Drug use: No    Home Medications Prior to Admission medications   Medication Sig Start Date End Date Taking? Authorizing Provider  amLODipine (NORVASC) 5 MG tablet Take 5 mg by mouth daily. 07/02/18  Yes [provider]  aspirin EC 81 MG tablet Take 81 mg by mouth daily.   Yes [provider]  nortriptyline (PAMELOR) 50 MG capsule Take 50 mg by mouth at bedtime. 01/02/20  Yes [provider]  pregabalin (LYRICA) 100 MG capsule Take 100 mg by mouth 3 (three) times daily. 11/03/19  Yes [provider]  rosuvastatin (CRESTOR) 10 MG tablet Take 20 mg by mouth every evening.   Yes [provider]  venlafaxine (EFFEXOR) 75 MG tablet Take 75 mg by mouth daily.   Yes [provider]    Allergies    Amantadine, Atenolol, Atorvastatin, Pyridostigmine, Simvastatin, and Amantadines  Review of Systems   Review of Systems  Cardiovascular: Positive for chest pain.  All other systems reviewed and are negative.   Physical Exam Updated Vital Signs BP (!) 136/102   Pulse 75   Temp 98.4 F (36.9 C) (Oral)   Resp 16   Ht 6\' 1"  (1.854 m)   Wt 108.9 kg   SpO2 100%   BMI 31.66 kg/m   Physical Exam Vitals and nursing note reviewed.  Constitutional:      General: He is not in acute distress.    Appearance: He is well-developed and well-nourished. He is not ill-appearing.  HENT:     Head: Normocephalic and atraumatic.  Eyes:     Conjunctiva/sclera: Conjunctivae normal.  Cardiovascular:     Rate and Rhythm: Normal rate and regular rhythm.     Pulses: Normal pulses.     Comments: Radial pulses are strong and equal  bilaterally.  Midline sternal surgical scar present Pulmonary:     Effort: Pulmonary effort is normal. No respiratory distress.     Breath sounds: Normal breath sounds.  Abdominal:     General: Bowel sounds are normal.     Palpations: Abdomen is soft.     Tenderness: There is no abdominal tenderness.  Musculoskeletal:     Right lower leg: No edema.     Left lower leg: No edema.  Skin:    General: Skin is warm.  Neurological:     Mental Status: He is alert.     Comments: Speech is fluent without aphasia. CN intact. Normal tone and coordination. Equal sensation to all 4 extremities with light touch.   Psychiatric:        Mood and Affect: Mood and affect normal.        Behavior: Behavior normal.  ED Results / Procedures / Treatments   Labs (all labs ordered are listed, but only abnormal results are displayed) Labs Reviewed  BASIC METABOLIC PANEL - Abnormal; Notable for the following components:      Result Value   Glucose, Bld 114 (*)    All other components within normal limits  CBC  TROPONIN I (HIGH SENSITIVITY)  TROPONIN I (HIGH SENSITIVITY)    EKG EKG Interpretation  Date/Time:  Sunday February 29 2020 17:10:56 EST Ventricular Rate:  72 PR Interval:  176 QRS Duration: 110 QT Interval:  380 QTC Calculation: 416 R Axis:   -45 Text Interpretation: Sinus rhythm Borderline prolonged PR interval Left axis deviation No significant change was found Confirmed by Glynn Octave [phone]) on 02/29/2020 5:22:37 PM   Radiology DG Chest 2 View  Result Date: 02/29/2020 CLINICAL DATA:  Chest pain. EXAM: CHEST - 2 VIEW COMPARISON:  May 18, 2014. FINDINGS: The heart size and mediastinal contours are within normal limits. Left subclavian approach cardiac rhythm maintenance device. Median sternotomy. Both lungs are clear. No visible pleural effusions or pneumothorax. No acute osseous abnormality. IMPRESSION: No active cardiopulmonary disease. Electronically Signed   By: Feliberto Harts MD   On: 02/29/2020 14:13    Procedures Procedures (including critical care time)  Medications Ordered in ED Medications  aspirin chewable tablet 324 mg (324 mg Oral Given 02/29/20 1739)    ED Course  I have reviewed the triage vital signs and the nursing notes.  Pertinent labs & imaging results that were available during my care of the patient were reviewed by me and considered in my medical decision making (see chart for details).    MDM Rules/Calculators/A&P HEAR Score: 4                        VSS, no tracheal deviation, no JVD or new murmur, RRR, breath sounds equal bilaterally, EKG without acute abnormalities, negative troponin x2, and negative CXR. Pacemaker interrogated with no events today. Patient had normal coronaries on cath in 2019. He is followed by Dr. Graciela Husbands. Hear score 4. Shared decision making with patient, patient is appropriate for discharge to home with close cardiology follow up. He seems reliable for follow up. Pt has been advised to return to the ED if CP becomes exertional, associated with diaphoresis or nausea, radiates to left jaw/arm, worsens or becomes concerning in any way. Pt appears reliable for follow up and is agreeable to discharge.   Patient was discussed with and evaluated by Dr. Manus Gunning.  Discussed results, findings, treatment and follow up. Patient advised of return precautions. Patient verbalized understanding and agreed with plan.  Final Clinical Impression(s) / ED Diagnoses Final diagnoses:  Precordial chest pain    Rx / DC Orders ED Discharge Orders    None       Offie Waide, [place] N, PA-C 02/29/20 2003    Markia Kyer, [place] N, [place] 02/29/20 2317    Glynn Octave, MD 03/01/20 1058

## 2020-02-29 NOTE — ED Triage Notes (Signed)
Patient complains of chest tightness and left arm heaviness around 1130 lasting about 10 minutes. Patient reports heaviness to chest resolved but complains of ongoing arm heaviness. Alert and oriented

## 2020-02-29 NOTE — Discharge Instructions (Addendum)
Follow up with your cardiologist in 2 days.  Return to the ER for new or worsening symptoms; including worsening chest pain, shortness of breath, pain that radiates to the arm or neck, pain or shortness of breath worsened with exertion.

## 2020-02-29 NOTE — ED Notes (Signed)
Discharge instructions discussed with pt. Pt verbalized understanding. Pt stable and ambulatory. No signature pad available. 

## 2020-03-01 ENCOUNTER — Emergency Department (HOSPITAL_COMMUNITY): Payer: BC Managed Care – PPO

## 2020-03-01 ENCOUNTER — Other Ambulatory Visit: Payer: Self-pay

## 2020-03-01 ENCOUNTER — Telehealth: Payer: Self-pay | Admitting: Internal Medicine

## 2020-03-01 ENCOUNTER — Emergency Department (HOSPITAL_COMMUNITY)
Admission: EM | Admit: 2020-03-01 | Discharge: 2020-03-01 | Disposition: A | Discharge status: Home / Self Care | Payer: BC Managed Care – PPO | Attending: Emergency Medicine | Admitting: Emergency Medicine

## 2020-03-01 DIAGNOSIS — R200 Anesthesia of skin: Secondary | ICD-10-CM | POA: Diagnosis not present

## 2020-03-01 DIAGNOSIS — Z5321 Procedure and treatment not carried out due to patient leaving prior to being seen by health care provider: Secondary | ICD-10-CM | POA: Diagnosis not present

## 2020-03-01 NOTE — Telephone Encounter (Signed)
Jason Osborn is calling stating he was recently seen in the ED and they just called him stating they recommend he have a CT performed. [place] is wanting to know how to proceed due to there not being a order in the system for a CT at this time. Please advise.

## 2020-03-01 NOTE — Progress Notes (Signed)
CARDIOLOGY OFFICE NOTE  Date:  03/02/2020    Jason Osborn Date of Birth: [birth year] Medical Record #[id number]  PCP:  Vivien Presto, MD  Cardiologist:  Izetta Dakin    Chief Complaint  Patient presents with  . Follow-up  . Hospitalization Follow-up    Seen for Dr. Suzan Nailer    History of Present Illness: Jason Osborn is a 54 y.o. male who presents today for a work in/post ER visit. Seen for Dr. Graciela Husbands and Tenny Craw.   He has a history of bicuspid aortic valve, thoracic aortic aneurysm - s/p repair at [facility] in October 2019. Had post of AF. Was on amiodarone for a short time.   Other history includes PPM since 11/2014 for syncope with heart block noted on prior tilt table testing - had lead revision in 12/2017. Noted POTS as well and neuropathy. Has had care thru the [facility] and at [facility] apparently.    He was last seen by Dr. Tenny Craw in September and Dr. Graciela Husbands in November.   In the ER twice this past weekend with chest pain - and then noted heaviness and numbness in the L face- no history of CAD - clean coronaries by cath in 2019. CT of the head was negative (done on return visit). CXR was negative. He did not stay and elected to leave.   Comes in today. Here with his wife. He notes that Sunday morning his "orthostatics were off" - this is not uncommon for him - not an easy morning - then had left sided chest pain/pressure - thought it might be indigestion - left arm felt heavy - left side of his face started feeling numb. This lasted for about 10 to 15 minutes - then resolved. Has not returned. Scheduled for MRI thru [facility] in early 2022. No real exercise program. BP is ok - he feels it is too high - still has spells of just "falling out". Uses a walker. Not short of breath.   Past Medical History:  Diagnosis Date  . Aneurysm, ascending aorta (HCC) 05/19/2014  . Atrial fibrillation (HCC)    POST OPERATIVE  . Atypical chest pain    NEGATIVE ADENOSINE CMR 05/23/17, NORMAL  RIGHT AND LEFT HEART CATH AT [facility] ON 12/19/17  . Bicuspid aortic valve   . Cardiac pacemaker in situ   . Dizziness   . Elevated liver function tests   . Essential hypertension   . Fatty liver   . Hypercholesteremia   . Hyperlipidemia   . Hypertension   . Left elbow pain   . Near syncope 05/18/2014  . Neuropathy   . Syncope 05/18/2014   CARDIOINHIBITOR SYNCOPE S/P PPM S/P DISRUPTION OF ATRIAL LEAD FROM ABOVE SURGICAL PROCEDURE S/P LASER LEAD EXTRACTION/VISION 10/11 AT [facility]  . Tendinopathy of elbow   . Thoracic aortic aneurysm (HCC)    S/P ASCENDING HEMIARCH WITH AV REPAIR AT [facility] ON 12/20/17    Past Surgical History:  Procedure Laterality Date  . KNEE ARTHROSCOPY  2004, 1986  . PACEMAKER INSERTION       Medications: Current Meds  Medication Sig  . amLODipine (NORVASC) 5 MG tablet Take 5 mg by mouth daily.  Marland Kitchen aspirin EC 81 MG tablet Take 81 mg by mouth daily.  . nortriptyline (PAMELOR) 50 MG capsule Take 50 mg by mouth at bedtime.  . pregabalin (LYRICA) 100 MG capsule Take 100 mg by mouth 3 (three) times daily.  . rosuvastatin (CRESTOR) 10 MG tablet Take  20 mg by mouth every evening.  . venlafaxine (EFFEXOR) 75 MG tablet Take 75 mg by mouth daily.     Allergies: Allergies  Allergen Reactions  . Amantadine Other (See Comments)    Nausea, low blood pressure Nausea, low blood pressure   . Atenolol Other (See Comments)  . Atorvastatin Other (See Comments) and Rash    Frequent urination Other reaction(s): Other Cerner listed no reactions Body Aches Frequent urination Cerner listed no reactions Muscle aches Body Aches Other reaction(s): Other Cerner listed no reactions Body Aches Frequent urination Frequent urination Cerner listed no reactions Muscle aches Frequent urination Cerner listed no reactions Muscle aches   . Pyridostigmine Other (See Comments) and Rash    Body Aches *able to take in small doses  Body Aches Other reaction(s): Other (see  comments) Extremity weakness   . Simvastatin Other (See Comments) and Rash    Body aches Cerner listed no reactions Body aches Increased urination Body aches Cerner listed no reactions Body aches Body aches Body aches Cerner listed no reactions Increased urination Body aches Cerner listed no reactions Increased urination   . Amantadines     Social History: The patient  reports that he has never smoked. He has never used smokeless tobacco. He reports current alcohol use. He reports that he does not use drugs.   Family History: The patient's family history includes Healthy in his mother.   Review of Systems: Please see the history of present illness.   All other systems are reviewed and negative.   Physical Exam: VS:  BP 140/78   Pulse 93   Ht 6\' 1"  (1.854 m)   Wt 241 lb 3.2 oz (109.4 kg)   SpO2 96%   BMI 31.82 kg/m  .  BMI Body mass index is 31.82 kg/m.  Wt Readings from Last 3 Encounters:  03/02/20 241 lb 3.2 oz (109.4 kg)  03/01/20 240 lb (108.9 kg)  02/29/20 240 lb (108.9 kg)    General: Alert and in no acute distress.   Cardiac: Regular rate and rhythm. No murmurs, rubs, or gallops. No edema.  Respiratory:  Lungs are clear to auscultation bilaterally with normal work of breathing.  GI: Soft and nontender.  MS: No deformity or atrophy. Gait and ROM intact.  Skin: Warm and dry. Color is normal.  Neuro:  Strength and sensation are intact and no gross focal deficits noted.  Psych: Alert, appropriate and with normal affect.   LABORATORY DATA:  EKG:  EKG is not ordered today.    Lab Results  Component Value Date   WBC 6.8 02/29/2020   HGB 14.6 02/29/2020   HCT 44.8 02/29/2020   PLT 267 02/29/2020   GLUCOSE 114 (H) 02/29/2020   ALT 36 05/18/2014   AST 22 05/18/2014   NA 141 02/29/2020   K 4.6 02/29/2020   CL 106 02/29/2020   CREATININE 1.11 02/29/2020   BUN 10 02/29/2020   CO2 23 02/29/2020   TSH 1.345 05/18/2014     BNP (last 3 results) No  results for input(s): BNP in the last 8760 hours.  ProBNP (last 3 results) No results for input(s): PROBNP in the last 8760 hours.   Other Studies Reviewed Today:  Cardiac MRI 10/11/18     1. The left ventricle is normal in cavity size. There is mild concentric LV hypertrophy. Global systolic   function is hyperdynamic with an LV ejection fraction calculated at 76%. There are no regional wall motion   abnormalities.  2. The right ventricle is normal in cavity size, wall thickness, and systolic function. A RV lead is seen   ending at the apical septum.      3. Both atria are normal in size. An RA lead is seen.      4. The aortic valve is bicuspid in morphology. The patient is status post aortic valve repair (raphe   shaving/debridement of conjoint cusp). There is no significant aortic stenosis. The peak aortic valve area   by planimetry is 4.1 cm2. Peak flow velocity at the aortic valve is 1.9 m/s. There is a central jet of mild   aortic regurgitation. No other significant valvular disease seen.      5. Delayed enhancement MRI for viability is normal. There is no evidence of myocardial infarction,   scarring, or infiltration.      6. No LV thrombus seen.         Thoracic MRA:      1.  The patient is status post ascending aortic and hemiarch replacement. The aortic root is top-normal in   size. There is no extravasation of contrast or aortic dissection seen. The tubular ascending aorta, aortic   arch and descending thoracic aorta are normal in diameter.       Bi-orthogonal luminal aortic dimensions are listed below:      Sinuses of Valsalva: 4.0 x 3.5 cm   Sinotubular junction: 2.8 x 2.7 cm   Asc. aorta (at level of PA bifurcation): 3.0 x 3.0 cm    Prox. arch (prior to the right innominate artery): 2.9 x 2.9 cm   Transverse arch: 2.6 x 2.5 cm    Descending thoracic aorta (PA): 2.6 x 2.4 cm (2.4 x 2.3 cm)   Descending aorta (diaphragm): 2.4 x 2.3 cm (2.3 x 2.2 cm)            2.  The aortic arch is left sided. There is normal branching of the arch vessels.       3.  The main and proximal branch pulmonary arteries are normal in size.       4.  There are normal systemic and pulmonary venous connections.           ASSESSMENT AND PLAN:   1. Recent ER visit for chest pain - has had "clean coronaries" by prior cath prior to his heart surgery in 2019. Worry that he has had reimplantation of the coronaries and that this could be causing an issue - will arrange for coronary CTA for further disposition.   2. Long history of syncope - PPM in place.   3. POTS - BP is fine- explained that this elevated reading is ok given his situation.   4. Bicuspid AV with prior AVR and aortic replacement  5. Small fiber neuropathy   Current medicines are reviewed with the patient today.  The patient does not have concerns regarding medicines other than what has been noted above.  The following changes have been made:  See above.  Labs/ tests ordered today include:   No orders of the defined types were placed in this encounter.    Disposition:   FU with Dr. Tenny Craw in a few weeks. Challenging situation.    Patient is agreeable to this plan and will call if any problems develop in the interim.   SignedNorma Fredrickson, NP  03/02/2020 10:41 AM  [facility] [address] [place], [place]  [address] Phone: [phone] Fax: [phone]        

## 2020-03-01 NOTE — Telephone Encounter (Signed)
Jason Osborn  good morning  if he is asymptomatic, a HR of 150 is not inappropriate-- we should reprogram when he comes in so as to avoid alerts Thanks SK

## 2020-03-01 NOTE — Telephone Encounter (Signed)
Spoke with pt and advised appointment has been scheduled with Norma Fredrickson, NP tomorrow 03/02/2020.  Pt states he received a phone call from ED provider as king him to return to ED for CT scan based on pt's symptoms yesterday.  Pt advised he should return to ED today based on providers request that he be further evaluated.  Pt verbalizes understanding and agrees with current plan.

## 2020-03-01 NOTE — ED Notes (Signed)
Patient walked up to me, stated that he was leaving. Stated that he understood the wait, and thanked me for out time.

## 2020-03-01 NOTE — ED Triage Notes (Addendum)
Stated he is back today because he was advised to come back for a head CT d/t left sided numbness. Onset at 1100 am yesterday

## 2020-03-02 ENCOUNTER — Encounter: Payer: Self-pay | Admitting: Nurse Practitioner

## 2020-03-02 ENCOUNTER — Ambulatory Visit: Payer: BC Managed Care – PPO | Admitting: Nurse Practitioner

## 2020-03-02 VITALS — BP 140/78 | HR 93 | Ht 73.0 in | Wt 241.2 lb

## 2020-03-02 DIAGNOSIS — I259 Chronic ischemic heart disease, unspecified: Secondary | ICD-10-CM

## 2020-03-02 DIAGNOSIS — Z8679 Personal history of other diseases of the circulatory system: Secondary | ICD-10-CM | POA: Diagnosis not present

## 2020-03-02 DIAGNOSIS — I712 Thoracic aortic aneurysm, without rupture, unspecified: Secondary | ICD-10-CM

## 2020-03-02 DIAGNOSIS — Z9889 Other specified postprocedural states: Secondary | ICD-10-CM | POA: Diagnosis not present

## 2020-03-02 MED ORDER — METOPROLOL TARTRATE 100 MG PO TABS: 100.0000 mg | ORAL_TABLET | ORAL | 0 refills | Status: DC

## 2020-03-02 NOTE — Patient Instructions (Addendum)
After Visit Summary:  We will be checking the following labs today - NONE   Medication Instructions:    Continue with your current medicines.    If you need a refill on your cardiac medications before your next appointment, please call your pharmacy.     Testing/Procedures To Be Arranged:  Coronary CT  Follow-Up:   See Dr. Harrington Challenger in the next few weeks.     At [facility], you and your health needs are our priority.  As part of our continuing mission to provide you with exceptional heart care, we have created designated Provider Care Teams.  These Care Teams include your primary Cardiologist (physician) and Advanced Practice Providers (APPs -  Physician Assistants and Nurse Practitioners) who all work together to provide you with the care you need, when you need it.  Special Instructions:  . Stay safe, wash your hands for at least 20 seconds and wear a mask when needed.  . It was good to talk with you today.    Your cardiac CT will be scheduled at one of the below locations:   [facility] [address] [place], [place] [address] [phone]  If scheduled at [facility], please arrive at the [facility] main entrance of [facility] 30 minutes prior to test start time. Proceed to the [facility] Radiology Department (first floor) to check-in and test prep.   Please follow these instructions carefully (unless otherwise directed):  Hold all erectile dysfunction medications at least 3 days (72 hrs) prior to test.  On the Night Before the Test: . Be sure to Drink plenty of water. . Do not consume any caffeinated/decaffeinated beverages or chocolate 12 hours prior to your test. . Do not take any antihistamines 12 hours prior to your test.   On the Day of the Test: . Drink plenty of water. Do not drink any water within one hour of the test. . Do not eat any food 4 hours prior to the test. . You may take your regular medications prior to  the test.  . Take metoprolol (Lopressor) 100 mg two hours prior to test.       After the Test: . Drink plenty of water. . After receiving IV contrast, you may experience a mild flushed feeling. This is normal. . On occasion, you may experience a mild rash up to 24 hours after the test. This is not dangerous. If this occurs, you can take Benadryl 25 mg and increase your fluid intake. . If you experience trouble breathing, this can be serious. If it is severe call 911 IMMEDIATELY. If it is mild, please call our office. . If you take any of these medications: Glipizide/Metformin, Avandament, Glucavance, please do not take 48 hours after completing test unless otherwise instructed.   Once we have confirmed authorization from your insurance company, we will call you to set up a date and time for your test. Based on how quickly your insurance processes prior authorizations requests, please allow up to 4 weeks to be contacted for scheduling your Cardiac CT appointment. Be advised that routine Cardiac CT appointments could be scheduled as many as 8 weeks after your provider has ordered it.  For non-scheduling related questions, please contact the cardiac imaging nurse navigator should you have any questions/concerns: Marchia Bond, Cardiac Imaging Nurse Navigator Burley Saver, Interim Cardiac Imaging Nurse [place] [facility] Direct Office Dial: [phone]   For scheduling needs, including cancellations and rescheduling,  please call [place], [phone].     Call the [place] office at [phone] if you have any questions, problems or concerns.

## 2020-03-08 ENCOUNTER — Telehealth (HOSPITAL_COMMUNITY): Payer: Self-pay | Admitting: Emergency Medicine

## 2020-03-08 NOTE — Telephone Encounter (Signed)
Attempted to call patient regarding upcoming cardiac CT appointment. °Left message on voicemail with name and callback number °Marajade Lei RN Navigator Cardiac Imaging °[place] [facility] °[phone] Office °[phone] Cell ° °

## 2020-03-09 ENCOUNTER — Ambulatory Visit (HOSPITAL_COMMUNITY)
Admission: RE | Admit: 2020-03-09 | Discharge: 2020-03-09 | Disposition: A | Discharge status: Home / Self Care | Payer: BC Managed Care – PPO | Source: Ambulatory Visit | Attending: Nurse Practitioner | Admitting: Nurse Practitioner

## 2020-03-09 ENCOUNTER — Other Ambulatory Visit: Payer: Self-pay

## 2020-03-09 DIAGNOSIS — I712 Thoracic aortic aneurysm, without rupture, unspecified: Secondary | ICD-10-CM

## 2020-03-09 DIAGNOSIS — I259 Chronic ischemic heart disease, unspecified: Secondary | ICD-10-CM

## 2020-03-09 DIAGNOSIS — Z9889 Other specified postprocedural states: Secondary | ICD-10-CM | POA: Diagnosis present

## 2020-03-09 DIAGNOSIS — Z8679 Personal history of other diseases of the circulatory system: Secondary | ICD-10-CM | POA: Diagnosis present

## 2020-03-09 MED ORDER — SODIUM CHLORIDE 0.9 % IV BOLUS
500.0000 mL | Freq: Once | INTRAVENOUS | Status: AC
  Administered 2020-03-09: 500 mL via INTRAVENOUS

## 2020-03-09 MED ORDER — NITROGLYCERIN 0.4 MG SL SUBL
0.4000 mg | SUBLINGUAL_TABLET | Freq: Once | SUBLINGUAL | Status: AC
  Administered 2020-03-09: 0.4 mg via SUBLINGUAL

## 2020-03-09 MED ORDER — NITROGLYCERIN 0.4 MG SL SUBL
SUBLINGUAL_TABLET | SUBLINGUAL | Status: AC
  Filled 2020-03-09: qty 1

## 2020-03-09 MED ORDER — IOHEXOL 350 MG/ML SOLN
100.0000 mL | Freq: Once | INTRAVENOUS | Status: AC | PRN
  Administered 2020-03-09: 100 mL via INTRAVENOUS

## 2020-03-09 MED ORDER — NITROGLYCERIN 0.4 MG SL SUBL: 0.8000 mg | SUBLINGUAL_TABLET | Freq: Once | SUBLINGUAL | Status: DC

## 2020-03-15 ENCOUNTER — Telehealth: Payer: Self-pay | Admitting: Internal Medicine

## 2020-03-15 DIAGNOSIS — R591 Generalized enlarged lymph nodes: Secondary | ICD-10-CM

## 2020-03-15 NOTE — Telephone Encounter (Signed)
I spoke to pt last week about CT of chest   Coronary arteries look good The pt does have lymphadenopathy in chest    I have reviewed with pulmonary   They thought referring to pulmonary would be important so that these can be followed closely  Recomm: please place referral for R Byrum if possible in pulmonary    I left VM with pt that appt would be set up

## 2020-03-15 NOTE — Telephone Encounter (Signed)
Referral placed to Dr. Delton Coombes at [facility].

## 2020-03-15 NOTE — Addendum Note (Signed)
Addended by: Lendon Ka on: 03/15/2020 02:33 PM   Modules accepted: Orders

## 2020-04-08 ENCOUNTER — Institutional Professional Consult (permissible substitution): Payer: BC Managed Care – PPO | Admitting: Emergency Medicine

## 2020-04-12 ENCOUNTER — Other Ambulatory Visit: Payer: Self-pay

## 2020-04-12 ENCOUNTER — Ambulatory Visit: Payer: BC Managed Care – PPO | Admitting: Internal Medicine

## 2020-04-12 ENCOUNTER — Encounter: Payer: Self-pay | Admitting: Internal Medicine

## 2020-04-12 VITALS — BP 140/76 | HR 90 | Ht 73.0 in | Wt 248.6 lb

## 2020-04-12 DIAGNOSIS — I4891 Unspecified atrial fibrillation: Secondary | ICD-10-CM | POA: Diagnosis not present

## 2020-04-12 NOTE — Progress Notes (Unsigned)
Cardiology Office Note   Date:  04/12/2020   ID:  Jason Osborn, DOB June 24, 1965, MRN [id number]  PCP:  Vivien Presto, MD  Cardiologist:   Dietrich Pates, MD    F/U of dizziness, syncope   History of Present Illness: Jason Osborn is a 55 y.o. male with a history of bicuspid aortic valve, thoracic aortic aneurysm - s/p repair at [facility] in October 2019. Had post of AF. Was on amiodarone for a short time.   Other history includes PPM since 11/2014 for syncope and heart block noted on prior tilt table testing - had lead revision in 12/2017. Noted POTS as well and neuropathy. Has had care thru the [facility] and [facility]  The pt had a near syncpal spell in March 2020  Seen at [facility]   Seen by Michele Rockers in ED who recomm hydration.   I saw the pt after that for the first time in July 2020   The last time I saw him in clinic was in Sept 2021  He  was still having dizziness and spells of syncope (abrupt onset and resolution of syncope; no signif prodrome)  . The pt was seen by Iona Hansen in September 2021  Set up for event monitor  THis showed SR   PVCs that were sensed   NSVT (asymptomatic)  His CLS was reprogrammed to increase rate from 120 -140.     ALso recomm OTC magnesium  In Dec 2021 the pt was seen twice in [facility] ED with chest pain - and heaviness/ numbness in the L face- He has no history of CAD - clean coronaries by cath in 2019. CT of the head was negative (done on return visit). CXR was negative. He did not stay and elected to leave.     The pt was seen by L Gerhardt in follow up on 12/21 He noted  that one  morning his "orthostatics were off" - and that Riley Nearing was not uncommon for him - not an easy morning - also said he had left sided chest pain/pressure - thought it might be indigestion - left arm felt heavy - left side of his face started feeling numb. This lasted for about 10 to 15 minutes - then resolved. Has not returned.  Scheduled for MRI thru [facility] in early  2022.  L Gerhardt scheduled the patient for a CT coronary angiogram   Calcium score was 0   He did have lymphadenopathy noted in lungs    Based on these results the pt has been set up to be seen in Pulm (R Bryrum)  The pt returns today.  He is here with his wife He says since seen he has had no furhter syncopal spells  He will get light headed at times  He says that his fit bit that he wears will occasionally have sudden drops in heart rate    Has not had further numbness     HE was seen by ENT   They ques could he have sarcoid   ALso ? LINQ       Current Meds  Medication Sig  . amLODipine (NORVASC) 5 MG tablet Take 5 mg by mouth daily.  Marland Kitchen aspirin EC 81 MG tablet Take 81 mg by mouth daily.  . nortriptyline (PAMELOR) 50 MG capsule Take 50 mg by mouth at bedtime.  . pregabalin (LYRICA) 100 MG capsule Take 100 mg by mouth 3 (three) times daily.  . rosuvastatin (CRESTOR)  10 MG tablet Take 20 mg by mouth every evening.  . sildenafil (VIAGRA) 50 MG tablet Take 50 mg by mouth as directed.  . venlafaxine (EFFEXOR) 75 MG tablet Take 75 mg by mouth daily.     Allergies:   Amantadine, Atenolol, Atorvastatin, Pyridostigmine, Simvastatin, and Amantadines   Past Medical History:  Diagnosis Date  . Aneurysm, ascending aorta (HCC) 05/19/2014  . Atrial fibrillation (HCC)    POST OPERATIVE  . Atypical chest pain    NEGATIVE ADENOSINE CMR 05/23/17, NORMAL RIGHT AND LEFT HEART CATH AT [facility] ON 12/19/17  . Bicuspid aortic valve   . Cardiac pacemaker in situ   . Dizziness   . Elevated liver function tests   . Essential hypertension   . Fatty liver   . Hypercholesteremia   . Hyperlipidemia   . Hypertension   . Left elbow pain   . Near syncope 05/18/2014  . Neuropathy   . Syncope 05/18/2014   CARDIOINHIBITOR SYNCOPE S/P PPM S/P DISRUPTION OF ATRIAL LEAD FROM ABOVE SURGICAL PROCEDURE S/P LASER LEAD EXTRACTION/VISION 10/11 AT [facility]  . Tendinopathy of elbow   . Thoracic aortic aneurysm (HCC)    S/P  ASCENDING HEMIARCH WITH AV REPAIR AT [facility] ON 12/20/17    Past Surgical History:  Procedure Laterality Date  . KNEE ARTHROSCOPY  2004, 1986  . PACEMAKER INSERTION       Social History:  The patient  reports that he has never smoked. He has never used smokeless tobacco. He reports current alcohol use. He reports that he does not use drugs.   Family History:  The patient's family history includes Healthy in his mother.    ROS:  Please see the history of present illness. All other systems are reviewed and  Negative to the above problem except as noted.    PHYSICAL EXAM: VS:  BP 140/76   Pulse 90   Ht 6\' 1"  (1.854 m)   Wt 248 lb 9.6 oz (112.8 kg)   BMI 32.80 kg/m   BP 130/70  HR 83   Laying BP 128/74  P 87   Sitting Standing 2 min   134/70  P 92   4 min  140/72  P 94  GEN:  Pt is an obese 55 yo, in no acute distress  HEENT: normal  Neck: no JVD, carotid bruits Cardiac: RRR; no murmurs,   No LE  edema  Respiratory:  clear to auscultation bilaterally,  GI: soft, nontender, nondistended, + BS  No hepatomegaly  MS: no deformity Moving all extremities   Skin: warm and dry, no rash Neuro:  Strength and sensation are intact Psych: euthymic mood, full affect   EKG:  EKG is ordered today.  SR 90 bpm    LAD      Lipid Panel No results found for: CHOL, TRIG, HDL, CHOLHDL, VLDL, LDLCALC, LDLDIRECT    Wt Readings from Last 3 Encounters:  04/12/20 248 lb 9.6 oz (112.8 kg)  03/02/20 241 lb 3.2 oz (109.4 kg)  03/01/20 240 lb (108.9 kg)      ASSESSMENT AND PLAN:  1/  Syncope   Pt with PPM  And normal rhythm but still having spells of syncope    WIll review with 03/03/20 utility of putiting in [place] for closer f/u / interrogation of heart rate     2  Spells   I am not sure what is the cause for the L sided spells   So far work up negative  Follow   WIll reiview with Odessa Fleming 2  Hx of CHB   S/P PPM (Biotronic)  4  Hx ascending aortic aneurysm   S/p Repair  3  Hx pulmonary  lymphadenopathy   Pt with appt in pulmonary in the next few days        Current medicines are reviewed at length with the patient today.  The patient does not have concerns regarding medicines.  Signed, Dietrich Pates, MD  04/12/2020 10:46 AM    [facility] [address] [place], [place], [place]  [address] Phone: [phone]; Fax: [phone]

## 2020-04-12 NOTE — Patient Instructions (Signed)
Medication Instructions:  No changes *If you need a refill on your cardiac medications before your next appointment, please call your pharmacy*   Lab Work: none If you have labs (blood work) drawn today and your tests are completely normal, you will receive your results only by: . MyChart Message (if you have MyChart) OR . A paper copy in the mail If you have any lab test that is abnormal or we need to change your treatment, we will call you to review the results.   Testing/Procedures: none   Follow-Up: At [facility], you and your health needs are our priority.  As part of our continuing mission to provide you with exceptional heart care, we have created designated Provider Care Teams.  These Care Teams include your primary Cardiologist (physician) and Advanced Practice Providers (APPs -  Physician Assistants and Nurse Practitioners) who all work together to provide you with the care you need, when you need it.   Your next appointment:   6 month(s)  The format for your next appointment:   In Person  Provider:   You may see Paula Ross, MD or one of the following Advanced Practice Providers on your designated Care Team:    Scott Weaver, PA-C  Vin Bhagat, PA-C   Other Instructions   

## 2020-04-15 ENCOUNTER — Other Ambulatory Visit: Payer: Self-pay

## 2020-04-15 ENCOUNTER — Ambulatory Visit: Payer: BC Managed Care – PPO | Admitting: Emergency Medicine

## 2020-04-15 ENCOUNTER — Encounter: Payer: Self-pay | Admitting: Emergency Medicine

## 2020-04-15 VITALS — BP 136/80 | HR 88 | Temp 97.3°F | Ht 73.0 in | Wt 250.2 lb

## 2020-04-15 DIAGNOSIS — R590 Localized enlarged lymph nodes: Secondary | ICD-10-CM | POA: Diagnosis not present

## 2020-04-15 NOTE — Patient Instructions (Addendum)
We will work on scheduling bronchoscopy with endobronchial ultrasound and lymph node biopsies.  This we done at [facility] under general anesthesia.  You will need a designated driver.  We will plan to stop your aspirin 2 days prior to the procedure.  You will be called with the specific date, time, instructions. We will arrange for pulmonary function testing at your next office visit Follow with Dr. Delton Coombes next available with full pulmonary function testing on the same day.

## 2020-04-15 NOTE — Progress Notes (Signed)
Subjective:    Patient ID: Jason Osborn, male    DOB: Aug 08, 1965, 55 y.o.   MRN: [id number]  HPI 55 year old never smoker with a history of bicuspid aortic valve, thoracic aneurysm and repair 12/2017, c/b L ptx.  That course was complicated by postoperative atrial fibrillation and he was on amiodarone for short period of time.  He has a pacemaker for syncope and heart block.  No evidence of CAD by left heart cath 2019 but he is been under more recent evaluation for some left-sided chest pressure, left arm heaviness, left facial numbness.  A CT coronary angiogram with calcium scoring was performed 03/11/2020 as below.  He is referred for further evaluation of mediastinal lymphadenopathy.    He still has some episodes of acute sharp CP, can last up to 10 minutes, happens at rest. He also has some focal L sided pain that he can palpate. He has exertional dyspnea, has been present for 6-8 months. Still able to work in the yard, exert.   CT coronary 03/11/2020 reviewed by me, shows some borderline to moderate enlargement of B hilar and R mediastinal nodes. There is some bibasilar atx vs interstitial infiltrate as well. No discrete pulm nodules.    Review of Systems As per HPI  Past Medical History:  Diagnosis Date  . Aneurysm, ascending aorta (HCC) 05/19/2014  . Atrial fibrillation (HCC)    POST OPERATIVE  . Atypical chest pain    NEGATIVE ADENOSINE CMR 05/23/17, NORMAL RIGHT AND LEFT HEART CATH AT [facility] ON 12/19/17  . Bicuspid aortic valve   . Cardiac pacemaker in situ   . Dizziness   . Elevated liver function tests   . Essential hypertension   . Fatty liver   . Hypercholesteremia   . Hyperlipidemia   . Hypertension   . Left elbow pain   . Near syncope 05/18/2014  . Neuropathy   . Syncope 05/18/2014   CARDIOINHIBITOR SYNCOPE S/P PPM S/P DISRUPTION OF ATRIAL LEAD FROM ABOVE SURGICAL PROCEDURE S/P LASER LEAD EXTRACTION/VISION 10/11 AT [facility]  . Tendinopathy of elbow   . Thoracic aortic  aneurysm (HCC)    S/P ASCENDING HEMIARCH WITH AV REPAIR AT [facility] ON 12/20/17     Family History  Problem Relation Age of Onset  . Healthy Mother      Social History   Socioeconomic History  . Marital status: Married    Spouse name: Not on file  . Number of children: 2  . Years of education: Not on file  . Highest education level: Not on file  Occupational History  . Occupation: Administrator, arts  Tobacco Use  . Smoking status: Never Smoker  . Smokeless tobacco: Never Used  Vaping Use  . Vaping Use: Never used  Substance and Sexual Activity  . Alcohol use: Yes    Comment: weekend use, 2-3 drinks daily  . Drug use: No  . Sexual activity: Not on file  Other Topics Concern  . Not on file  Social History Narrative   Lives with wife, does yard work on the weekends.   Right hand   2 story home   Social Determinants of Health   Financial Resource Strain: Not on file  Food Insecurity: Not on file  Transportation Needs: Not on file  Physical Activity: Not on file  Stress: Not on file  Social Connections: Not on file  Intimate Partner Violence: Not on file     Allergies  Allergen Reactions  . Amantadine Other (See Comments)  Nausea, low blood pressure Nausea, low blood pressure   . Atenolol Other (See Comments)  . Atorvastatin Other (See Comments) and Rash    Frequent urination Other reaction(s): Other Cerner listed no reactions Body Aches Frequent urination Cerner listed no reactions Muscle aches Body Aches Other reaction(s): Other Cerner listed no reactions Body Aches Frequent urination Frequent urination Cerner listed no reactions Muscle aches Frequent urination Cerner listed no reactions Muscle aches   . Pyridostigmine Other (See Comments) and Rash    Body Aches *able to take in small doses  Body Aches Other reaction(s): Other (see comments) Extremity weakness   . Simvastatin Other (See Comments) and Rash    Body aches Cerner  listed no reactions Body aches Increased urination Body aches Cerner listed no reactions Body aches Body aches Body aches Cerner listed no reactions Increased urination Body aches Cerner listed no reactions Increased urination   . Amantadines      Outpatient Medications Prior to Visit  Medication Sig Dispense Refill  . amLODipine (NORVASC) 5 MG tablet Take 5 mg by mouth daily.    Marland Kitchen aspirin EC 81 MG tablet Take 81 mg by mouth daily.    . nortriptyline (PAMELOR) 50 MG capsule Take 50 mg by mouth at bedtime.    . pregabalin (LYRICA) 100 MG capsule Take 100 mg by mouth 3 (three) times daily.    . rosuvastatin (CRESTOR) 10 MG tablet Take 20 mg by mouth every evening.    . sildenafil (VIAGRA) 50 MG tablet Take 50 mg by mouth as directed.    . venlafaxine (EFFEXOR) 75 MG tablet Take 75 mg by mouth daily.     No facility-administered medications prior to visit.        Objective:   Physical Exam Vitals:   04/15/20 1512  BP: 136/80  Pulse: 88  Temp: (!) 97.3 F (36.3 C)  SpO2: 98%  Weight: 250 lb 3.2 oz (113.5 kg)  Height: 6\' 1"  (1.854 m)   Gen: Pleasant, well-nourished, in no distress,  normal affect  ENT: No lesions,  mouth clear,  oropharynx clear, no postnasal drip  Neck: No JVD, no stridor  Lungs: No use of accessory muscles, no crackles or wheezing on normal respiration, no wheeze on forced expiration  Cardiovascular: RRR, heart sounds normal, no murmur or gallops, no peripheral edema  Musculoskeletal: No deformities, no cyanosis or clubbing  Neuro: alert, awake, non focal  Skin: Warm, no lesions or rash      Assessment & Plan:  Hilar adenopathy Bilateral hilar adenopathy, slightly enlarged but definitely abnormal.  Etiology unclear.  Given his history of cardiac dysrhythmia, neuropathy, his bibasilar interstitial change on CT I suspect that this will be sarcoidosis.  Needs nodal biopsies to evaluate for this as well as possible infection, less likely  lymphoma.  Discussed bronchoscopy with him and he agrees.  We will arrange FOB with EBUS, hopefully on 2/15.  I will obtain pulmonary function testing given suspicion for possible sarcoidosis, his baseline dyspnea.  At some point he will need a dedicated high-resolution CT scan of the chest to evaluate his interstitial changes.   3/15, MD, PhD 04/15/2020, 3:46 PM [facility] [phone] or if no answer [phone]

## 2020-04-15 NOTE — Addendum Note (Signed)
Addended by: Dorisann Frames R on: 04/15/2020 04:21 PM   Modules accepted: Orders

## 2020-04-15 NOTE — Assessment & Plan Note (Signed)
Bilateral hilar adenopathy, slightly enlarged but definitely abnormal.  Etiology unclear.  Given his history of cardiac dysrhythmia, neuropathy, his bibasilar interstitial change on CT I suspect that this will be sarcoidosis.  Needs nodal biopsies to evaluate for this as well as possible infection, less likely lymphoma.  Discussed bronchoscopy with him and he agrees.  We will arrange FOB with EBUS, hopefully on 2/15.  I will obtain pulmonary function testing given suspicion for possible sarcoidosis, his baseline dyspnea.  At some point he will need a dedicated high-resolution CT scan of the chest to evaluate his interstitial changes.

## 2020-04-16 ENCOUNTER — Other Ambulatory Visit (HOSPITAL_COMMUNITY)
Admission: RE | Admit: 2020-04-16 | Discharge: 2020-04-16 | Disposition: A | Discharge status: Home / Self Care | Payer: BC Managed Care – PPO | Source: Ambulatory Visit | Attending: Emergency Medicine | Admitting: Emergency Medicine

## 2020-04-16 DIAGNOSIS — Z20822 Contact with and (suspected) exposure to covid-19: Secondary | ICD-10-CM | POA: Insufficient documentation

## 2020-04-16 DIAGNOSIS — Z01812 Encounter for preprocedural laboratory examination: Secondary | ICD-10-CM | POA: Insufficient documentation

## 2020-04-16 LAB — SARS CORONAVIRUS 2 (TAT 6-24 HRS): SARS Coronavirus 2: NEGATIVE

## 2020-04-19 ENCOUNTER — Telehealth: Payer: Self-pay | Admitting: Emergency Medicine

## 2020-04-19 ENCOUNTER — Ambulatory Visit (INDEPENDENT_AMBULATORY_CARE_PROVIDER_SITE_OTHER): Payer: BC Managed Care – PPO | Admitting: Emergency Medicine

## 2020-04-19 ENCOUNTER — Ambulatory Visit: Payer: BC Managed Care – PPO | Admitting: Emergency Medicine

## 2020-04-19 ENCOUNTER — Other Ambulatory Visit: Payer: Self-pay

## 2020-04-19 ENCOUNTER — Encounter: Payer: Self-pay | Admitting: Emergency Medicine

## 2020-04-19 DIAGNOSIS — R590 Localized enlarged lymph nodes: Secondary | ICD-10-CM

## 2020-04-19 LAB — PULMONARY FUNCTION TEST
DL/VA % pred: 84 %
DL/VA: 3.62 ml/min/mmHg/L
DLCO cor % pred: 79 %
DLCO cor: 24.91 ml/min/mmHg
DLCO unc % pred: 79 %
DLCO unc: 24.91 ml/min/mmHg
FEF 25-75 Post: 3.31 L/sec
FEF 25-75 Pre: 2.96 L/sec
FEF2575-%Change-Post: 11 %
FEF2575-%Pred-Post: 93 %
FEF2575-%Pred-Pre: 83 %
FEV1-%Change-Post: 3 %
FEV1-%Pred-Post: 89 %
FEV1-%Pred-Pre: 86 %
FEV1-Post: 3.74 L
FEV1-Pre: 3.62 L
FEV1FVC-%Change-Post: 6 %
FEV1FVC-%Pred-Pre: 99 %
FEV6-%Change-Post: -1 %
FEV6-%Pred-Post: 87 %
FEV6-%Pred-Pre: 89 %
FEV6-Post: 4.6 L
FEV6-Pre: 4.69 L
FEV6FVC-%Change-Post: 0 %
FEV6FVC-%Pred-Post: 103 %
FEV6FVC-%Pred-Pre: 103 %
FVC-%Change-Post: -2 %
FVC-%Pred-Post: 84 %
FVC-%Pred-Pre: 86 %
FVC-Post: 4.6 L
FVC-Pre: 4.73 L
Post FEV1/FVC ratio: 81 %
Post FEV6/FVC ratio: 100 %
Pre FEV1/FVC ratio: 76 %
Pre FEV6/FVC Ratio: 99 %
RV % pred: 113 %
RV: 2.6 L
TLC % pred: 98 %
TLC: 7.43 L

## 2020-04-19 NOTE — Assessment & Plan Note (Signed)
Suspicious for sarcoidosis.  His spirometry is grossly normal but with curve is low volume loop, could suggest some mild obstruction.  Likely does not merit therapy at this time.  He is scheduled for EBUS on 2/15 to better characterize his nodes.

## 2020-04-19 NOTE — Patient Instructions (Signed)
Your pulmonary function testing today is reassuring.  Overall normal airflows and lung volumes people we will use this as a baseline going forward.  We are planning for bronchoscopy with endobronchial ultrasound and lymph node biopsies on 2/15.  You will be called with more details about when to show up, etc. Follow with Dr Delton Coombes in 1 month

## 2020-04-19 NOTE — H&P (View-Only) (Signed)
Subjective:    Patient ID: Jason Osborn, male    DOB: Feb 25, 1966, 55 y.o.   MRN: [id number]  HPI 55 year old never smoker with a history of bicuspid aortic valve, thoracic aneurysm and repair 12/2017, c/b L ptx.  That course was complicated by postoperative atrial fibrillation and he was on amiodarone for short period of time.  He has a pacemaker for syncope and heart block.  No evidence of CAD by left heart cath 2019 but he is been under more recent evaluation for some left-sided chest pressure, left arm heaviness, left facial numbness.  A CT coronary angiogram with calcium scoring was performed 03/11/2020 as below.  He is referred for further evaluation of mediastinal lymphadenopathy.    He still has some episodes of acute sharp CP, can last up to 10 minutes, happens at rest. He also has some focal L sided pain that he can palpate. He has exertional dyspnea, has been present for 6-8 months. Still able to work in the yard, exert.   CT coronary 03/11/2020 reviewed by me, shows some borderline to moderate enlargement of B hilar and R mediastinal nodes. There is some bibasilar atx vs interstitial infiltrate as well. No discrete pulm nodules.   ROV 04/19/20 --follow-up visit 55 year old gentleman with a history of TIA, bicuspid aortic valve, post operative atrial fibrillation (brief amiodarone), heart block with pacemaker.  I saw him 04/15/2020 for dyspnea and bilateral hilar and mediastinal lymphadenopathy.  Question sarcoidosis.  He returns today following pulmonary function testing. He feels the same, has some decreased energy, fatigue. He is set for EBUS on 2/15.   PFT performed today reviewed by me, show grossly normal airflows without a bronchodilator response but with some concavity of his flow volume loop that could suggest mild obstruction., normal lung volumes, slight decrease in diffusion capacity that corrects to normal range when adjusted for his alveolar volume.   Review of Systems As per  HPI  Past Medical History:  Diagnosis Date  . Aneurysm, ascending aorta (HCC) 05/19/2014  . Atrial fibrillation (HCC)    POST OPERATIVE  . Atypical chest pain    NEGATIVE ADENOSINE CMR 05/23/17, NORMAL RIGHT AND LEFT HEART CATH AT [facility] ON 12/19/17  . Bicuspid aortic valve   . Cardiac pacemaker in situ   . Dizziness   . Elevated liver function tests   . Essential hypertension   . Fatty liver   . Hypercholesteremia   . Hyperlipidemia   . Hypertension   . Left elbow pain   . Near syncope 05/18/2014  . Neuropathy   . Syncope 05/18/2014   CARDIOINHIBITOR SYNCOPE S/P PPM S/P DISRUPTION OF ATRIAL LEAD FROM ABOVE SURGICAL PROCEDURE S/P LASER LEAD EXTRACTION/VISION 10/11 AT [facility]  . Tendinopathy of elbow   . Thoracic aortic aneurysm (HCC)    S/P ASCENDING HEMIARCH WITH AV REPAIR AT [facility] ON 12/20/17     Family History  Problem Relation Age of Onset  . Healthy Mother      Social History   Socioeconomic History  . Marital status: Married    Spouse name: Not on file  . Number of children: 2  . Years of education: Not on file  . Highest education level: Not on file  Occupational History  . Occupation: Administrator, arts  Tobacco Use  . Smoking status: Never Smoker  . Smokeless tobacco: Never Used  Vaping Use  . Vaping Use: Never used  Substance and Sexual Activity  . Alcohol use: Yes    Comment: weekend  use, 2-3 drinks daily  . Drug use: No  . Sexual activity: Not on file  Other Topics Concern  . Not on file  Social History Narrative   Lives with wife, does yard work on the weekends.   Right hand   2 story home   Social Determinants of Health   Financial Resource Strain: Not on file  Food Insecurity: Not on file  Transportation Needs: Not on file  Physical Activity: Not on file  Stress: Not on file  Social Connections: Not on file  Intimate Partner Violence: Not on file     Allergies  Allergen Reactions  . Amantadine Other (See Comments)    Nausea, low  blood pressure Nausea, low blood pressure   . Atenolol Other (See Comments)  . Atorvastatin Other (See Comments) and Rash    Frequent urination Other reaction(s): Other Cerner listed no reactions Body Aches Frequent urination Cerner listed no reactions Muscle aches Body Aches Other reaction(s): Other Cerner listed no reactions Body Aches Frequent urination Frequent urination Cerner listed no reactions Muscle aches Frequent urination Cerner listed no reactions Muscle aches   . Pyridostigmine Other (See Comments) and Rash    Body Aches *able to take in small doses  Body Aches Other reaction(s): Other (see comments) Extremity weakness   . Simvastatin Other (See Comments) and Rash    Body aches Cerner listed no reactions Body aches Increased urination Body aches Cerner listed no reactions Body aches Body aches Body aches Cerner listed no reactions Increased urination Body aches Cerner listed no reactions Increased urination   . Amantadines      Outpatient Medications Prior to Visit  Medication Sig Dispense Refill  . amLODipine (NORVASC) 5 MG tablet Take 5 mg by mouth daily.    Marland Kitchen aspirin EC 81 MG tablet Take 81 mg by mouth daily.    . nortriptyline (PAMELOR) 50 MG capsule Take 50 mg by mouth at bedtime.    . pregabalin (LYRICA) 100 MG capsule Take 100 mg by mouth 3 (three) times daily.    . rosuvastatin (CRESTOR) 10 MG tablet Take 20 mg by mouth every evening.    . sildenafil (VIAGRA) 50 MG tablet Take 50 mg by mouth as directed.    . venlafaxine (EFFEXOR) 75 MG tablet Take 75 mg by mouth daily.     No facility-administered medications prior to visit.        Objective:   Physical Exam Vitals:   04/19/20 1424  BP: 130/78  Pulse: 79  Temp: (!) 97.5 F (36.4 C)  SpO2: 96%  Weight: 249 lb (112.9 kg)  Height: 6\' 1"  (1.854 m)   Gen: Pleasant, well-nourished, in no distress,  normal affect  ENT: No lesions,  mouth clear,  oropharynx clear, no  postnasal drip  Neck: No JVD, no stridor  Lungs: No use of accessory muscles, no crackles or wheezing on normal respiration, no wheeze on forced expiration  Cardiovascular: RRR, heart sounds normal, no murmur or gallops, no peripheral edema  Musculoskeletal: No deformities, no cyanosis or clubbing  Neuro: alert, awake, non focal  Skin: Warm, no lesions or rash      Assessment & Plan:  Hilar adenopathy Suspicious for sarcoidosis.  His spirometry is grossly normal but with curve is low volume loop, could suggest some mild obstruction.  Likely does not merit therapy at this time.  He is scheduled for EBUS on 2/15 to better characterize his nodes.   3/15, MD, PhD 04/19/2020, 2:58 PM [facility] [phone] or if no answer [phone]

## 2020-04-19 NOTE — Telephone Encounter (Signed)
The following has been scheduled & patient has been notified:  COVID TEST   2/12 @ 11 BRONCH/EBUS 2/15 @ 11:`15

## 2020-04-19 NOTE — Progress Notes (Signed)
Subjective:    Patient ID: Jason Osborn, male    DOB: Feb 25, 1966, 55 y.o.   MRN: [id number]  HPI 55 year old never smoker with a history of bicuspid aortic valve, thoracic aneurysm and repair 12/2017, c/b L ptx.  That course was complicated by postoperative atrial fibrillation and he was on amiodarone for short period of time.  He has a pacemaker for syncope and heart block.  No evidence of CAD by left heart cath 2019 but he is been under more recent evaluation for some left-sided chest pressure, left arm heaviness, left facial numbness.  A CT coronary angiogram with calcium scoring was performed 03/11/2020 as below.  He is referred for further evaluation of mediastinal lymphadenopathy.    He still has some episodes of acute sharp CP, can last up to 10 minutes, happens at rest. He also has some focal L sided pain that he can palpate. He has exertional dyspnea, has been present for 6-8 months. Still able to work in the yard, exert.   CT coronary 03/11/2020 reviewed by me, shows some borderline to moderate enlargement of B hilar and R mediastinal nodes. There is some bibasilar atx vs interstitial infiltrate as well. No discrete pulm nodules.   ROV 04/19/20 --follow-up visit 55 year old gentleman with a history of TIA, bicuspid aortic valve, post operative atrial fibrillation (brief amiodarone), heart block with pacemaker.  I saw him 04/15/2020 for dyspnea and bilateral hilar and mediastinal lymphadenopathy.  Question sarcoidosis.  He returns today following pulmonary function testing. He feels the same, has some decreased energy, fatigue. He is set for EBUS on 2/15.   PFT performed today reviewed by me, show grossly normal airflows without a bronchodilator response but with some concavity of his flow volume loop that could suggest mild obstruction., normal lung volumes, slight decrease in diffusion capacity that corrects to normal range when adjusted for his alveolar volume.   Review of Systems As per  HPI  Past Medical History:  Diagnosis Date  . Aneurysm, ascending aorta (HCC) 05/19/2014  . Atrial fibrillation (HCC)    POST OPERATIVE  . Atypical chest pain    NEGATIVE ADENOSINE CMR 05/23/17, NORMAL RIGHT AND LEFT HEART CATH AT [facility] ON 12/19/17  . Bicuspid aortic valve   . Cardiac pacemaker in situ   . Dizziness   . Elevated liver function tests   . Essential hypertension   . Fatty liver   . Hypercholesteremia   . Hyperlipidemia   . Hypertension   . Left elbow pain   . Near syncope 05/18/2014  . Neuropathy   . Syncope 05/18/2014   CARDIOINHIBITOR SYNCOPE S/P PPM S/P DISRUPTION OF ATRIAL LEAD FROM ABOVE SURGICAL PROCEDURE S/P LASER LEAD EXTRACTION/VISION 10/11 AT [facility]  . Tendinopathy of elbow   . Thoracic aortic aneurysm (HCC)    S/P ASCENDING HEMIARCH WITH AV REPAIR AT [facility] ON 12/20/17     Family History  Problem Relation Age of Onset  . Healthy Mother      Social History   Socioeconomic History  . Marital status: Married    Spouse name: Not on file  . Number of children: 2  . Years of education: Not on file  . Highest education level: Not on file  Occupational History  . Occupation: Administrator, arts  Tobacco Use  . Smoking status: Never Smoker  . Smokeless tobacco: Never Used  Vaping Use  . Vaping Use: Never used  Substance and Sexual Activity  . Alcohol use: Yes    Comment: weekend  use, 2-3 drinks daily  . Drug use: No  . Sexual activity: Not on file  Other Topics Concern  . Not on file  Social History Narrative   Lives with wife, does yard work on the weekends.   Right hand   2 story home   Social Determinants of Health   Financial Resource Strain: Not on file  Food Insecurity: Not on file  Transportation Needs: Not on file  Physical Activity: Not on file  Stress: Not on file  Social Connections: Not on file  Intimate Partner Violence: Not on file     Allergies  Allergen Reactions  . Amantadine Other (See Comments)    Nausea, low  blood pressure Nausea, low blood pressure   . Atenolol Other (See Comments)  . Atorvastatin Other (See Comments) and Rash    Frequent urination Other reaction(s): Other Cerner listed no reactions Body Aches Frequent urination Cerner listed no reactions Muscle aches Body Aches Other reaction(s): Other Cerner listed no reactions Body Aches Frequent urination Frequent urination Cerner listed no reactions Muscle aches Frequent urination Cerner listed no reactions Muscle aches   . Pyridostigmine Other (See Comments) and Rash    Body Aches *able to take in small doses  Body Aches Other reaction(s): Other (see comments) Extremity weakness   . Simvastatin Other (See Comments) and Rash    Body aches Cerner listed no reactions Body aches Increased urination Body aches Cerner listed no reactions Body aches Body aches Body aches Cerner listed no reactions Increased urination Body aches Cerner listed no reactions Increased urination   . Amantadines      Outpatient Medications Prior to Visit  Medication Sig Dispense Refill  . amLODipine (NORVASC) 5 MG tablet Take 5 mg by mouth daily.    . aspirin EC 81 MG tablet Take 81 mg by mouth daily.    . nortriptyline (PAMELOR) 50 MG capsule Take 50 mg by mouth at bedtime.    . pregabalin (LYRICA) 100 MG capsule Take 100 mg by mouth 3 (three) times daily.    . rosuvastatin (CRESTOR) 10 MG tablet Take 20 mg by mouth every evening.    . sildenafil (VIAGRA) 50 MG tablet Take 50 mg by mouth as directed.    . venlafaxine (EFFEXOR) 75 MG tablet Take 75 mg by mouth daily.     No facility-administered medications prior to visit.        Objective:   Physical Exam Vitals:   04/19/20 1424  BP: 130/78  Pulse: 79  Temp: (!) 97.5 F (36.4 C)  SpO2: 96%  Weight: 249 lb (112.9 kg)  Height: 6' 1" (1.854 m)   Gen: Pleasant, well-nourished, in no distress,  normal affect  ENT: No lesions,  mouth clear,  oropharynx clear, no  postnasal drip  Neck: No JVD, no stridor  Lungs: No use of accessory muscles, no crackles or wheezing on normal respiration, no wheeze on forced expiration  Cardiovascular: RRR, heart sounds normal, no murmur or gallops, no peripheral edema  Musculoskeletal: No deformities, no cyanosis or clubbing  Neuro: alert, awake, non focal  Skin: Warm, no lesions or rash      Assessment & Plan:  Hilar adenopathy Suspicious for sarcoidosis.  His spirometry is grossly normal but with curve is low volume loop, could suggest some mild obstruction.  Likely does not merit therapy at this time.  He is scheduled for EBUS on 2/15 to better characterize his nodes.   Less Woolsey, MD, PhD 04/19/2020, 2:58 PM [place]   [facility] [phone] or if no answer [phone]

## 2020-04-19 NOTE — Progress Notes (Signed)
PFT done today. 

## 2020-04-24 ENCOUNTER — Other Ambulatory Visit (HOSPITAL_COMMUNITY)
Admission: RE | Admit: 2020-04-24 | Discharge: 2020-04-24 | Disposition: A | Discharge status: Home / Self Care | Payer: BC Managed Care – PPO | Source: Ambulatory Visit | Attending: Emergency Medicine | Admitting: Emergency Medicine

## 2020-04-24 DIAGNOSIS — Z01812 Encounter for preprocedural laboratory examination: Secondary | ICD-10-CM | POA: Insufficient documentation

## 2020-04-24 DIAGNOSIS — Z79899 Other long term (current) drug therapy: Secondary | ICD-10-CM | POA: Diagnosis not present

## 2020-04-24 DIAGNOSIS — Z7982 Long term (current) use of aspirin: Secondary | ICD-10-CM | POA: Diagnosis not present

## 2020-04-24 DIAGNOSIS — R590 Localized enlarged lymph nodes: Secondary | ICD-10-CM | POA: Diagnosis present

## 2020-04-24 DIAGNOSIS — Z20822 Contact with and (suspected) exposure to covid-19: Secondary | ICD-10-CM | POA: Insufficient documentation

## 2020-04-24 LAB — SARS CORONAVIRUS 2 (TAT 6-24 HRS): SARS Coronavirus 2: NEGATIVE

## 2020-04-26 ENCOUNTER — Encounter (HOSPITAL_COMMUNITY): Payer: Self-pay | Admitting: Emergency Medicine

## 2020-04-26 ENCOUNTER — Encounter: Payer: Self-pay | Admitting: Internal Medicine

## 2020-04-26 NOTE — Progress Notes (Signed)
Spoke with pt for pre-op call. Pt has hx of A-fib and has a pacemaker. Dr. Tenny Craw and Dr. Graciela Husbands are his cardiologist. Hx of a Thoracic Aortic aneurysm that has been repaired. Denies being diabetic.   Covid test done on 04/24/20 and it's negative.  Pt states he's been in quarantine since the test was done and understands that he stays in quarantine until he comes to the hospital in the morning. Pt does have a doctor's appt at [facility] today and understands to wear his mask, social distance and wash hands.  Chart sent to [facility] for review.  Device information sent to the Device clinic.

## 2020-04-26 NOTE — Progress Notes (Signed)
PERIOPERATIVE PRESCRIPTION FOR IMPLANTED CARDIAC DEVICE PROGRAMMING   Patient Information: Name:Jason Osborn, Jason Osborn   DOB: 1965/12/15  MRN: [id number]    Planned Procedure: Video Bronchoscopy  Surgeon: Delton Coombes  Date of Procedure: 04/27/20  Cautery will be used.  Position during surgery: supine   Please send documentation back to:  [facility] (Fax # [phone])   Hennie Duos, RN  04/26/2020 8:39 AM     Device Information:   Clinic EP Physician:   Sherryl Manges, MD Device Type:  Pacemaker Manufacturer and Phone #:  [employer]: [phone] Pacemaker Dependent?:  No Date of Last Device Check:  02/12/2020        Normal Device Function?:  Yes     Electrophysiologist's Recommendations:    Have magnet available.  Provide continuous ECG monitoring when magnet is used or reprogramming is to be performed.   Procedure may interfere with device function.  Magnet should be placed over device during procedure.  Per Device Clinic Standing Orders, Uvaldo Rising  04/26/2020 11:42 AM

## 2020-04-26 NOTE — Anesthesia Preprocedure Evaluation (Addendum)
Anesthesia Evaluation  Patient identified by MRN, date of birth, ID band Patient awake    Reviewed: Allergy & Precautions, NPO status , Patient's Chart, lab work & pertinent test results  Airway Mallampati: II  TM Distance: >3 FB Neck ROM: Full    Dental no notable dental hx.    Pulmonary sleep apnea and Continuous Positive Airway Pressure Ventilation , Current Smoker and Patient abstained from smoking.,    Pulmonary exam normal breath sounds clear to auscultation       Cardiovascular hypertension, Normal cardiovascular exam+ pacemaker + Valvular Problems/Murmurs (bicuspid aortic valve s/p repair)  Rhythm:Regular Rate:Normal     Neuro/Psych  Neuromuscular disease (small fiber neuroapthy) negative psych ROS   GI/Hepatic negative GI ROS, Neg liver ROS,   Endo/Other  negative endocrine ROS  Renal/GU negative Renal ROS  negative genitourinary   Musculoskeletal negative musculoskeletal ROS (+)   Abdominal Normal abdominal exam  (+)   Peds negative pediatric ROS (+)  Hematology negative hematology ROS (+)   Anesthesia Other Findings   Reproductive/Obstetrics negative OB ROS                           Anesthesia Physical Anesthesia Plan  ASA: IV  Anesthesia Plan: General   Post-op Pain Management:    Induction: Intravenous  PONV Risk Score and Plan: Ondansetron, Dexamethasone and Treatment may vary due to age or medical condition  Airway Management Planned: Oral ETT  Additional Equipment: None  Intra-op Plan:   Post-operative Plan: Extubation in OR  Informed Consent: I have reviewed the patients History and Physical, chart, labs and discussed the procedure including the risks, benefits and alternatives for the proposed anesthesia with the patient or authorized representative who has indicated his/her understanding and acceptance.       Plan Discussed with: CRNA and  Anesthesiologist  Anesthesia Plan Comments: (PAT note by Antionette Poles, PA-C: Follows with cardiology for hx ofbicuspid aortic valve, thoracic aortic aneurysm-s/p repair (as well as AV repair; raphe shaving/debridement of conjoined cusp) at [facility] in October 2019. Had post of AF. Was on amiodarone for a short time. 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 Noted POTS as well and neuropathy. Has had care thru the [facility] and [facility]. No evidence of CAD by left heart cath 2019 but he is been under more recent evaluation for some left-sided chest pressure, left arm heaviness, left facial numbness.  A CT coronary angiogram with calcium scoring was performed 03/11/2020 showing no evidence of CAD.  It did however show borderline to moderate enlargement of B hilar and R mediastinal nodes.  Patient was last seen by Dr. Tenny Craw 04/12/2020 and per note patient has a pacemaker and normal rhythm but is still having spells of syncope.  Under consideration for placement of a Linq device for closer follow-up and interrogation of heart rate.  It was discussed that he would be following with pulmonary for lymphadenopathy seen on CTA.  He was referred to pulmonology and has been seen by Dr. Delton Coombes.  Suspicion for sarcoidosis.  Spirometry was grossly normal.  Will need DOS labs and evaluation.   EKG 04/12/20: Sinus rhythm. Rate 90.  Perioperative EP device recommendations: Device Information:  Clinic EP Physician:Steven Graciela Husbands, MD Device Type:Pacemaker Manufacturer and Phone #:Biotronik: [phone] Pacemaker Dependent?:No Date of Last Device Check:12/2/2021Normal Device Function?:Yes  Electrophysiologist's Recommendations:  Have magnet available.  Provide continuous ECG monitoring when magnet is used or reprogramming is to be performed.  Procedure may  interfere with device function.  Magnet should be placed over device during procedure.   Coronary CTA 03/09/2020: IMPRESSION: 1. No evidence of CAD, CADRADS = 0. Coronary ostia are on native aortic root; Dacron grafts are distal to the coronary ostia.  2. Coronary calcium score of 0. This was 0 percentile for age and sex matched control.  3. Normal coronary origin with right dominance.  4. History of bicuspid aortic valve and ascending aortic aneurysm S/P aortic valve repair (raphe shaving/debridement of conjoint cusp, Sievers 1) and ascending aorta/hemiarch repair 12/20/17 (28 mm ascending Dacron hemiarch graft and 28 mm Dacron graft from sinotubular junction to arch graft). I cannot see post-surgical images, but report reviewed from [facility] dated 10/12/2018 for comparison. There was an area of concern about 12 mm distal to the sewing ring of the aortic root graft. I reviewed the images with Dr. Llana Aliment, and he felt this was postsurgical and not dissection.  5. Lymphadenopathy as noted, recommend clinical correlation and consider repeat imaging.  Cardiac MRI 10/11/18 (care everywhere): Cardiac MRI:  1. The left ventricle is normal in cavity size. There is mild concentric LV hypertrophy. Global systolic  function is hyperdynamic with an LV ejection fraction calculated at 76%. There are no regional wall motion  abnormalities.   2. The right ventricle is normal in cavity size, wall thickness, and systolic function. A RV lead is seen  ending at the apical septum.   3. Both atria are normal in size. An RA lead is seen.   4. The aortic valve is bicuspid in morphology. The patient is status post aortic valve repair (raphe  shaving/debridement of conjoint cusp). There is no significant aortic stenosis. The peak aortic valve area  by planimetry is 4.1 cm2. Peak flow velocity at the aortic valve is 1.9 m/s. There is a central jet of mild  aortic regurgitation. Noother  significant valvular disease seen.   5. Delayed enhancement MRI for viability is normal. There is no evidence of myocardial infarction,  scarring, or infiltration.   6. No LV thrombus seen.   Cath 12/19/2017 ([facility]): Impressions:   Normal hemodynamics, CI  Normal coronary arteries   Recommendations:   Admit for ascending aortic aneurysm repair as schedule by Dr. Kizzie Bane  )       Anesthesia Quick Evaluation

## 2020-04-26 NOTE — Progress Notes (Signed)
Spoke with Kipp Brood, rep for Biotroniks. He was made aware of pt's procedure and the need for a magnet. He asked if we had one and I said yes.

## 2020-04-26 NOTE — Progress Notes (Signed)
Anesthesia Chart Review: Same day workup  Follows with cardiology for hx ofbicuspid aortic valve, thoracic aortic aneurysm-s/p repair (as well as AV repair; raphe shaving/debridement of conjoined cusp) at [facility] in October 2019. Had post of AF. Was on amiodarone for a short time. 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 Noted POTS as well and neuropathy. Has had care thru [facility] and [facility]. No evidence of CAD by left heart cath 2019 but he is been under more recent evaluation for some left-sided chest pressure, left arm heaviness, left facial numbness.  A CT coronary angiogram with calcium scoring was performed 03/11/2020 showing no evidence of CAD.  It did however show borderline to moderate enlargement of B hilar and R mediastinal nodes.  Patient was last seen by Dr. Tenny Craw 04/12/2020 and per note patient has a pacemaker and normal rhythm but is still having spells of syncope.  Under consideration for placement of a Linq device for closer follow-up and interrogation of heart rate.  It was discussed that he would be following with pulmonary for lymphadenopathy seen on CTA.  He was referred to pulmonology and has been seen by Dr. Delton Coombes.  Suspicion for sarcoidosis.  Spirometry was grossly normal.  Will need DOS labs and evaluation.   EKG 04/12/20: Sinus rhythm. Rate 90.  Perioperative EP device recommendations: Device Information:  Clinic EP Physician:Steven Graciela Husbands, MD Device Type:Pacemaker Manufacturer and Phone #:Biotronik: [phone] Pacemaker Dependent?:No Date of Last Device Check:12/2/2021Normal Device Function?:Yes  Electrophysiologist's Recommendations:   Have magnet available.  Provide continuous ECG monitoring when magnet is used or reprogramming is to be performed.  Procedure may interfere with device function.   Magnet should be placed over device during procedure.   Coronary CTA 03/09/2020: IMPRESSION: 1. No evidence of CAD, CADRADS = 0. Coronary ostia are on native aortic root; Dacron grafts are distal to the coronary ostia.  2. Coronary calcium score of 0. This was 0 percentile for age and sex matched control.  3. Normal coronary origin with right dominance.  4. History of bicuspid aortic valve and ascending aortic aneurysm S/P aortic valve repair (raphe shaving/debridement of conjoint cusp, Sievers 1) and ascending aorta/hemiarch repair 12/20/17 (28 mm ascending Dacron hemiarch graft and 28 mm Dacron graft from sinotubular junction to arch graft). I cannot see post-surgical images, but report reviewed from [facility] dated 10/12/2018 for comparison. There was an area of concern about 12 mm distal to the sewing ring of the aortic root graft. I reviewed the images with Dr. Llana Aliment, and he felt this was postsurgical and not dissection.  5. Lymphadenopathy as noted, recommend clinical correlation and consider repeat imaging.  Cardiac MRI 10/11/18 (care everywhere): Cardiac MRI:  1. The left ventricle is normal in cavity size. There is mild concentric LV hypertrophy. Global systolic  function is hyperdynamic with an LV ejection fraction calculated at 76%. There are no regional wall motion  abnormalities.   2. The right ventricle is normal in cavity size, wall thickness, and systolic function. A RV lead is seen  ending at the apical septum.   3. Both atria are normal in size. An RA lead is seen.   4. The aortic valve is bicuspid in morphology. The patient is status post aortic valve repair (raphe  shaving/debridement of conjoint cusp). There is no significant aortic stenosis. The peak aortic valve area  by planimetry is 4.1  cm2. Peak flow velocity at the aortic valve is 1.9 m/s. There is a central jet of mild  aortic regurgitation. Noother significant valvular disease  seen.   5. Delayed enhancement MRI for viability is normal. There is no evidence of myocardial infarction,  scarring, or infiltration.   6. No LV thrombus seen.   Cath 12/19/2017 ([facility]): Impressions:   Normal hemodynamics, CI  Normal coronary arteries   Recommendations:   Admit for ascending aortic aneurysm repair as schedule by Dr. Katherine Basset [facility]/Anesthesiology Phone [phone] 04/26/2020 1:35 PM

## 2020-04-27 ENCOUNTER — Ambulatory Visit (HOSPITAL_COMMUNITY): Payer: BC Managed Care – PPO | Admitting: Physician Assistant

## 2020-04-27 ENCOUNTER — Ambulatory Visit (HOSPITAL_COMMUNITY)
Admission: RE | Admit: 2020-04-27 | Discharge: 2020-04-27 | Disposition: A | Discharge status: Home / Self Care | Payer: BC Managed Care – PPO | Attending: Emergency Medicine | Admitting: Emergency Medicine

## 2020-04-27 ENCOUNTER — Other Ambulatory Visit: Payer: Self-pay

## 2020-04-27 ENCOUNTER — Encounter (HOSPITAL_COMMUNITY)
Admission: RE | Disposition: A | Discharge status: Home / Self Care | Payer: Self-pay | Source: Home / Self Care | Attending: Emergency Medicine

## 2020-04-27 ENCOUNTER — Ambulatory Visit (HOSPITAL_COMMUNITY): Payer: BC Managed Care – PPO

## 2020-04-27 ENCOUNTER — Encounter (HOSPITAL_COMMUNITY): Payer: Self-pay | Admitting: Emergency Medicine

## 2020-04-27 DIAGNOSIS — Z7982 Long term (current) use of aspirin: Secondary | ICD-10-CM | POA: Insufficient documentation

## 2020-04-27 DIAGNOSIS — R590 Localized enlarged lymph nodes: Secondary | ICD-10-CM

## 2020-04-27 DIAGNOSIS — Z20822 Contact with and (suspected) exposure to covid-19: Secondary | ICD-10-CM | POA: Insufficient documentation

## 2020-04-27 DIAGNOSIS — Z79899 Other long term (current) drug therapy: Secondary | ICD-10-CM | POA: Insufficient documentation

## 2020-04-27 HISTORY — DX: Presence of cardiac pacemaker: Z95.0

## 2020-04-27 HISTORY — DX: Other constipation: K59.09

## 2020-04-27 HISTORY — PX: BRONCHIAL WASHINGS: SHX5105

## 2020-04-27 HISTORY — PX: BRONCHIAL NEEDLE ASPIRATION BIOPSY: SHX5106

## 2020-04-27 HISTORY — PX: VIDEO BRONCHOSCOPY WITH ENDOBRONCHIAL ULTRASOUND: SHX6177

## 2020-04-27 HISTORY — DX: Sleep apnea, unspecified: G47.30

## 2020-04-27 LAB — COMPREHENSIVE METABOLIC PANEL
ALT: 37 U/L (ref 0–44)
AST: 31 U/L (ref 15–41)
Albumin: 4 g/dL (ref 3.5–5.0)
Alkaline Phosphatase: 81 U/L (ref 38–126)
Anion gap: 8 (ref 5–15)
BUN: 10 mg/dL (ref 6–20)
CO2: 24 mmol/L (ref 22–32)
Calcium: 9.3 mg/dL (ref 8.9–10.3)
Chloride: 108 mmol/L (ref 98–111)
Creatinine, Ser: 1.12 mg/dL (ref 0.61–1.24)
GFR, Estimated: >60 mL/min (ref >60)
Glucose, Bld: 99 mg/dL (ref 70–99)
Potassium: 4.2 mmol/L (ref 3.5–5.1)
Sodium: 140 mmol/L (ref 135–145)
Total Bilirubin: 0.6 mg/dL (ref 0.3–1.2)
Total Protein: 7.3 g/dL (ref 6.5–8.1)

## 2020-04-27 LAB — CBC
HCT: 45.4 % (ref 39.0–52.0)
Hemoglobin: 14.4 g/dL (ref 13.0–17.0)
MCH: 28.4 pg (ref 26.0–34.0)
MCHC: 31.7 g/dL (ref 30.0–36.0)
MCV: 89.5 fL (ref 80.0–100.0)
Platelets: 268 10*3/uL (ref 150–400)
RBC: 5.07 MIL/uL (ref 4.22–5.81)
RDW: 12.1 % (ref 11.5–15.5)
WBC: 5.5 10*3/uL (ref 4.0–10.5)
nRBC: 0 % (ref 0.0–0.2)

## 2020-04-27 SURGERY — BRONCHOSCOPY, WITH EBUS
Anesthesia: General | Laterality: Bilateral

## 2020-04-27 MED ORDER — LACTATED RINGERS IV SOLN: INTRAVENOUS | Status: DC

## 2020-04-27 MED ORDER — PROPOFOL 10 MG/ML IV BOLUS
INTRAVENOUS | Status: DC | PRN
  Administered 2020-04-27: 150 mg via INTRAVENOUS

## 2020-04-27 MED ORDER — CHLORHEXIDINE GLUCONATE 0.12 % MT SOLN
15.0000 mL | Freq: Once | OROMUCOSAL | Status: AC
  Administered 2020-04-27: 15 mL via OROMUCOSAL

## 2020-04-27 MED ORDER — DEXAMETHASONE SODIUM PHOSPHATE 10 MG/ML IJ SOLN
INTRAMUSCULAR | Status: DC | PRN
  Administered 2020-04-27: 5 mg via INTRAVENOUS

## 2020-04-27 MED ORDER — PROMETHAZINE HCL 25 MG/ML IJ SOLN: 6.2500 mg | INTRAMUSCULAR | Status: DC | PRN

## 2020-04-27 MED ORDER — OXYCODONE HCL 5 MG/5ML PO SOLN: 5.0000 mg | Freq: Once | ORAL | Status: DC | PRN

## 2020-04-27 MED ORDER — MIDAZOLAM HCL 2 MG/2ML IJ SOLN
INTRAMUSCULAR | Status: DC | PRN
  Administered 2020-04-27: 2 mg via INTRAVENOUS

## 2020-04-27 MED ORDER — FENTANYL CITRATE (PF) 100 MCG/2ML IJ SOLN: 25.0000 ug | INTRAMUSCULAR | Status: DC | PRN

## 2020-04-27 MED ORDER — FENTANYL CITRATE (PF) 100 MCG/2ML IJ SOLN
INTRAMUSCULAR | Status: DC | PRN
  Administered 2020-04-27 (×2): 50 ug via INTRAVENOUS
  Administered 2020-04-27: 100 ug via INTRAVENOUS

## 2020-04-27 MED ORDER — AMISULPRIDE (ANTIEMETIC) 5 MG/2ML IV SOLN: 10.0000 mg | Freq: Once | INTRAVENOUS | Status: DC | PRN

## 2020-04-27 MED ORDER — LIDOCAINE 2% (20 MG/ML) 5 ML SYRINGE
INTRAMUSCULAR | Status: DC | PRN
  Administered 2020-04-27: 100 mg via INTRAVENOUS

## 2020-04-27 MED ORDER — SUGAMMADEX SODIUM 200 MG/2ML IV SOLN
INTRAVENOUS | Status: DC | PRN
  Administered 2020-04-27: 200 mg via INTRAVENOUS

## 2020-04-27 MED ORDER — ONDANSETRON HCL 4 MG/2ML IJ SOLN
INTRAMUSCULAR | Status: DC | PRN
  Administered 2020-04-27: 4 mg via INTRAVENOUS

## 2020-04-27 MED ORDER — OXYCODONE HCL 5 MG PO TABS: 5.0000 mg | ORAL_TABLET | Freq: Once | ORAL | Status: DC | PRN

## 2020-04-27 MED ORDER — ROCURONIUM BROMIDE 10 MG/ML (PF) SYRINGE
PREFILLED_SYRINGE | INTRAVENOUS | Status: DC | PRN
  Administered 2020-04-27: 70 mg via INTRAVENOUS
  Administered 2020-04-27: 10 mg via INTRAVENOUS

## 2020-04-27 MED ORDER — CHLORHEXIDINE GLUCONATE 0.12 % MT SOLN
OROMUCOSAL | Status: AC
  Filled 2020-04-27: qty 15

## 2020-04-27 NOTE — Op Note (Signed)
Video Bronchoscopy with Endobronchial Ultrasound Procedure Note  Date of Operation: 04/27/2020  Pre-op Diagnosis: Mediastinal lymphadenopathy  Post-op Diagnosis: Same  Surgeon: Baltazar Apo  Assistants: None  Anesthesia: General endotracheal anesthesia  Operation: Flexible video fiberoptic bronchoscopy with endobronchial ultrasound and biopsies.  Estimated Blood Loss: Minimal  Complications: None apparent  Indications and History: Jason Osborn is a 55 y.o. male with little past medical history.  He did have some atrial fibrillation following TAA repair complicated by pneumothorax 12/2017.  Recently evaluated for chest pain and underwent CT coronary angiogram 03/11/2020.  This study identified bilateral enlargement of mediastinal lymph nodes.  Recommendation was made to achieve tissue diagnosis via endobronchial ultrasound.  The risks, benefits, complications, treatment options and expected outcomes were discussed with the patient.  The possibilities of pneumothorax, pneumonia, reaction to medication, pulmonary aspiration, perforation of a viscus, bleeding, failure to diagnose a condition and creating a complication requiring transfusion or operation were discussed with the patient who freely signed the consent.    Description of Procedure: The patient was examined in the preoperative area and history and data from the preprocedure consultation were reviewed. It was deemed appropriate to proceed.  The patient was taken to [facility] endoscopy room 2, identified as Jason Osborn and the procedure verified as Flexible Video Fiberoptic Bronchoscopy.  A Time Out was held and the above information confirmed. After being taken to the operating room general anesthesia was initiated and the patient  was orally intubated. The video fiberoptic bronchoscope was introduced via the endotracheal tube and a general inspection was performed which showed normal airways throughout.  There were no abnormal  secretions or endobronchial lesions seen.  A bronchoalveolar lavage was performed in the anterior segment of the right upper lobe with 60 cc of normal saline instilled and approximately 25 cc returned.  This will be sent for cytology and microbiology. The standard scope was then withdrawn and the endobronchial ultrasound was used to identify and characterize the peritracheal, hilar and bronchial lymph nodes. Inspection showed enlarged nodes in the pretracheal and precarinal region, labeled as 4L and 4R.  There were also enlarged nodes seen at station 7 and station 10 R. Using real-time ultrasound guidance Wang needle biopsies were take from Station 4L, 4R, 7, 10 R nodes and were sent for cytology. The patient tolerated the procedure well without apparent complications. There was no significant blood loss. The bronchoscope was withdrawn. Anesthesia was reversed and the patient was taken to the PACU for recovery.   Samples: 1. Wang needle biopsies from 4L node 2. Wang needle biopsies from 4R node 3. Wang needle biopsies from 7 node 4. Wang needle biopsies from 10 R node 5. Bronchoalveolar lavage from anterior segment of right upper lobe  Plans:  The patient will be discharged from the PACU to home when recovered from anesthesia. We will review the cytology, pathology and microbiology results with the patient when they become available. Outpatient followup will be with Dr. Lamonte Sakai.    Baltazar Apo, MD, PhD 04/27/2020, 12:09 PM [facility] [phone] or if no answer before 7:00PM call [phone] For any issues after 7:00PM please call eLink [phone]

## 2020-04-27 NOTE — Anesthesia Procedure Notes (Signed)
Procedure Name: Intubation Date/Time: 04/27/2020 10:50 AM Performed by: Janace Litten, CRNA Pre-anesthesia Checklist: Patient identified, Emergency Drugs available, Suction available and Patient being monitored Patient Re-evaluated:Patient Re-evaluated prior to induction Oxygen Delivery Method: Circle System Utilized Preoxygenation: Pre-oxygenation with 100% oxygen Induction Type: IV induction Ventilation: Mask ventilation without difficulty Laryngoscope Size: Mac and 4 Grade View: Grade I Tube type: Oral Tube size: 8.5 mm Number of attempts: 1 Airway Equipment and Method: Stylet Placement Confirmation: ETT inserted through vocal cords under direct vision,  positive ETCO2 and breath sounds checked- equal and bilateral Secured at: 23 cm Tube secured with: Tape Dental Injury: Teeth and Oropharynx as per pre-operative assessment

## 2020-04-27 NOTE — Interval H&P Note (Signed)
History and Physical Interval Note:  04/27/2020 10:40 AM  Jason Osborn  has presented today for surgery, with the diagnosis of HILER LYMPHADENOPATHY.  The various methods of treatment have been discussed with the patient and family. After consideration of risks, benefits and other options for treatment, the patient has consented to  Procedure(s): VIDEO BRONCHOSCOPY WITH ENDOBRONCHIAL ULTRASOUND (Bilateral) as a surgical intervention.  The patient's history has been reviewed, patient examined, no change in status, stable for surgery.  I have reviewed the patient's chart and labs.  Questions were answered to the patient's satisfaction.     Leslye Peer

## 2020-04-27 NOTE — Transfer of Care (Signed)
Immediate Anesthesia Transfer of Care Note  Patient: Jason Osborn  Procedure(s) Performed: VIDEO BRONCHOSCOPY WITH ENDOBRONCHIAL ULTRASOUND (Bilateral ) BRONCHIAL WASHINGS BRONCHIAL NEEDLE ASPIRATION BIOPSIES  Patient Location: PACU  Anesthesia Type:General  Level of Consciousness: drowsy, patient cooperative and responds to stimulation  Airway & Oxygen Therapy: Patient Spontanous Breathing  Post-op Assessment: Report given to RN and Post -op Vital signs reviewed and stable  Post vital signs: Reviewed and stable  Last Vitals:  Vitals Value Taken Time  BP    Temp    Pulse    Resp    SpO2      Last Pain:  Vitals:   04/27/20 0928  TempSrc:   PainSc: 0-No pain         Complications: No complications documented.

## 2020-04-27 NOTE — Discharge Instructions (Signed)
Flexible Bronchoscopy, Care After This sheet gives you information about how to care for yourself after your test. Your doctor may also give you more specific instructions. If you have problems or questions, contact your doctor. Follow these instructions at home: Eating and drinking  Do not eat or drink anything (not even water) for 2 hours after your test, or until your numbing medicine (local anesthetic) wears off.  When your numbness is gone and your cough and gag reflexes have come back, you may: ? Eat only soft foods. ? Slowly drink liquids.  The day after the test, go back to your normal diet. Driving  Do not drive for 24 hours if you were given a medicine to help you relax (sedative).  Do not drive or use heavy machinery while taking prescription pain medicine. General instructions   Take over-the-counter and prescription medicines only as told by your doctor.  Return to your normal activities as told. Ask what activities are safe for you.  Do not use any products that have nicotine or tobacco in them. This includes cigarettes and e-cigarettes. If you need help quitting, ask your doctor.  Keep all follow-up visits as told by your doctor. This is important. It is very important if you had a tissue sample (biopsy) taken. Get help right away if:  You have shortness of breath that gets worse.  You get light-headed.  You feel like you are going to pass out (faint).  You have chest pain.  You cough up: ? More than a little blood. ? More blood than before. Summary  Do not eat or drink anything (not even water) for 2 hours after your test, or until your numbing medicine wears off.  Do not use cigarettes. Do not use e-cigarettes.  Get help right away if you have chest pain.  Please call our office for any questions or concerns.  [phone].  This information is not intended to replace advice given to you by your health care provider. Make sure you discuss any  questions you have with your health care provider. Document Released: 12/25/2008 Document Revised: 02/09/2017 Document Reviewed: 03/17/2016 Elsevier Patient Education  2020 [employer].

## 2020-04-27 NOTE — Anesthesia Postprocedure Evaluation (Signed)
Anesthesia Post Note  Patient: Jason Osborn  Procedure(s) Performed: VIDEO BRONCHOSCOPY WITH ENDOBRONCHIAL ULTRASOUND (Bilateral ) BRONCHIAL WASHINGS BRONCHIAL NEEDLE ASPIRATION BIOPSIES     Patient location during evaluation: PACU Anesthesia Type: General Level of consciousness: awake Pain management: pain level controlled Vital Signs Assessment: post-procedure vital signs reviewed and stable Respiratory status: spontaneous breathing and respiratory function stable Cardiovascular status: stable Postop Assessment: no apparent nausea or vomiting Anesthetic complications: no   No complications documented.  Last Vitals:  Vitals:   04/27/20 1222 04/27/20 1230  BP: (!) 151/55 (!) 113/52  Pulse: 89 83  Resp: 15 18  Temp: 36.6 C   SpO2: 93% 92%    Last Pain:  Vitals:   04/27/20 1230  TempSrc:   PainSc: 0-No pain                 Mellody Dance

## 2020-04-28 LAB — ACID FAST SMEAR (AFB, MYCOBACTERIA): Acid Fast Smear: NEGATIVE

## 2020-04-28 LAB — CYTOLOGY - NON PAP

## 2020-04-29 ENCOUNTER — Encounter (HOSPITAL_COMMUNITY): Payer: Self-pay | Admitting: Emergency Medicine

## 2020-04-29 LAB — CULTURE, BAL-QUANTITATIVE W GRAM STAIN: Culture: NO GROWTH

## 2020-04-30 ENCOUNTER — Telehealth: Payer: Self-pay | Admitting: Emergency Medicine

## 2020-04-30 NOTE — Telephone Encounter (Signed)
Discussed findings with patient. Lymphocytes present, no granulomas, cx all pending, no positive smears. Apparently there wasn't enough material to send for FLOW.   I recommended that we repeat his CT in 6 months w contrast, decide then whether his nodes need any further investigation, ? Repeat FOB

## 2020-04-30 NOTE — Telephone Encounter (Signed)
Call the patient to review pathology results, no answer, left message and will try him back.  All of his nodal biopsies have lymphocytes, no evidence of malignant cells.  Not clear to me that flow cytometry was done.  I will have to confirm this with pathology.

## 2020-05-02 LAB — AEROBIC/ANAEROBIC CULTURE W GRAM STAIN (SURGICAL/DEEP WOUND): Culture: NO GROWTH

## 2020-05-13 ENCOUNTER — Ambulatory Visit (INDEPENDENT_AMBULATORY_CARE_PROVIDER_SITE_OTHER): Payer: BC Managed Care – PPO

## 2020-05-13 DIAGNOSIS — I4891 Unspecified atrial fibrillation: Secondary | ICD-10-CM | POA: Diagnosis not present

## 2020-05-17 LAB — CUP PACEART REMOTE DEVICE CHECK
Date Time Interrogation Session: 20220302091356
Implantable Lead Implant Date: 20160901
Implantable Lead Implant Date: 20160901
Implantable Lead Location: 753859
Implantable Lead Location: 753860
Implantable Lead Model: 377
Implantable Lead Model: 377
Implantable Lead Serial Number: 49234000
Implantable Lead Serial Number: 49288022
Implantable Pulse Generator Implant Date: 20160901
Pulse Gen Model: 394929
Pulse Gen Serial Number: 68576339

## 2020-05-24 NOTE — Progress Notes (Signed)
Remote pacemaker transmission.   

## 2020-05-27 LAB — FUNGUS CULTURE WITH STAIN

## 2020-05-27 LAB — FUNGUS CULTURE RESULT

## 2020-05-27 LAB — FUNGAL ORGANISM REFLEX

## 2020-05-31 ENCOUNTER — Other Ambulatory Visit: Payer: Self-pay

## 2020-05-31 ENCOUNTER — Ambulatory Visit: Payer: BC Managed Care – PPO | Admitting: Emergency Medicine

## 2020-05-31 ENCOUNTER — Encounter: Payer: Self-pay | Admitting: Emergency Medicine

## 2020-05-31 VITALS — BP 122/78 | HR 88 | Temp 97.8°F | Ht 73.0 in | Wt 239.4 lb

## 2020-05-31 DIAGNOSIS — R590 Localized enlarged lymph nodes: Secondary | ICD-10-CM

## 2020-05-31 NOTE — Patient Instructions (Addendum)
We will plan to repeat your CT scan of the chest to follow your mediastinal lymph nodes.  If these remain enlarged or if we see any other evidence for lung inflammation then we will plan to perform repeat biopsies, either through repeat bronchoscopy or an alternative procedure. Please follow Dr. Delton Coombes next available after your CT so that we can discuss the results and plan procedure if indicated

## 2020-05-31 NOTE — Assessment & Plan Note (Signed)
There were no granulomas on his bronchoscopy from EBUS, all cultures are negative.  Flow cytometry was not done as there was not enough material.  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  He is being considered for possible immunosuppressive therapy by neurology.  We will plan to repeat his CT scan of the chest now with contrast, characterize his nodes in his parenchyma.  If there are abnormalities then we will plan repeat bronchoscopy with EBUS and probably transbronchial biopsies.  He may also be a candidate for mediastinoscopy we do not believe bronchoscopy can give [place] adequate tissue.

## 2020-05-31 NOTE — Progress Notes (Signed)
Subjective:    Patient ID: Jason Osborn, male    DOB: 20-Oct-1965, 55 y.o.   MRN: [id number]  HPI 55 year old never smoker with a history of bicuspid aortic valve, thoracic aneurysm and repair 12/2017, c/b L ptx.  That course was complicated by postoperative atrial fibrillation and he was on amiodarone for short period of time.  He has a pacemaker for syncope and heart block.  No evidence of CAD by left heart cath 2019 but he is been under more recent evaluation for some left-sided chest pressure, left arm heaviness, left facial numbness.  A CT coronary angiogram with calcium scoring was performed 03/11/2020 as below.  He is referred for further evaluation of mediastinal lymphadenopathy.    He still has some episodes of acute sharp CP, can last up to 10 minutes, happens at rest. He also has some focal L sided pain that he can palpate. He has exertional dyspnea, has been present for 6-8 months. Still able to work in the yard, exert.   CT coronary 03/11/2020 reviewed by me, shows some borderline to moderate enlargement of B hilar and R mediastinal nodes. There is some bibasilar atx vs interstitial infiltrate as well. No discrete pulm nodules.   ROV 04/19/20 --follow-up visit 55 year old gentleman with a history of TIA, bicuspid aortic valve, post operative atrial fibrillation (brief amiodarone), heart block with pacemaker.  I saw him 04/15/2020 for dyspnea and bilateral hilar and mediastinal lymphadenopathy.  Question sarcoidosis.  He returns today following pulmonary function testing. He feels the same, has some decreased energy, fatigue. He is set for EBUS on 2/15.   PFT performed today reviewed by me, show grossly normal airflows without a bronchodilator response but with some concavity of his flow volume loop that could suggest mild obstruction., normal lung volumes, slight decrease in diffusion capacity that corrects to normal range when adjusted for his alveolar volume.  ROV 05/31/20 --follow-up visit  55 year old gentleman whom I have seen for hilar mediastinal adenopathy.  He also has a history of bicuspid AV, heart block with a pacer, TIA.  He underwent bronchoscopy and EBUS 2/15.  Pathology did not show any granulomas, did show lymphocytes.  Culture information Showed negative smears, fungal negative, AFB still pending, normal bacterial flora.  Flow cytometry was not performed. He is seeing Dr Ronalee Red in Neuro at [facility] - he is presuming a dx of Neurosarcoidosis, and is planning to treat with anti-inflammatory regimen.   MDM: Reviewed neurology notes from [facility], Dr. Ronalee Red, 04/2020  Review of Systems As per HPI     Objective:   Physical Exam Vitals:   05/31/20 1337  BP: 122/78  Pulse: 88  Temp: 97.8 F (36.6 C)  TempSrc: Temporal  SpO2: 98%  Weight: 239 lb 6.4 oz (108.6 kg)  Height: 6\' 1"  (1.854 m)   Gen: Pleasant, well-nourished, in no distress,  normal affect  ENT: No lesions,  mouth clear,  oropharynx clear, no postnasal drip  Neck: No JVD, no stridor  Lungs: No use of accessory muscles, no crackles or wheezing on normal respiration, no wheeze on forced expiration  Cardiovascular: RRR, heart sounds normal, no murmur or gallops, no peripheral edema  Musculoskeletal: No deformities, no cyanosis or clubbing  Neuro: alert, awake, non focal  Skin: Warm, no lesions or rash      Assessment & Plan:  Hilar adenopathy There were no granulomas on his bronchoscopy from EBUS, all cultures are negative.  Flow cytometry was not done as there was not enough material.  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  He is being considered for possible immunosuppressive therapy by neurology.  We will plan to repeat his CT scan of the chest now with contrast, characterize his nodes in his parenchyma.  If there are abnormalities then we will plan repeat bronchoscopy with EBUS and probably transbronchial  biopsies.  He may also be a candidate for mediastinoscopy we do not believe bronchoscopy can give [place] adequate tissue.   Levy Pupa, MD, PhD 05/31/2020, 3:18 PM [facility] [phone] or if no answer [phone]

## 2020-06-10 LAB — ACID FAST CULTURE WITH REFLEXED SENSITIVITIES (MYCOBACTERIA): Acid Fast Culture: NEGATIVE

## 2020-06-14 NOTE — Telephone Encounter (Signed)
Patient sent email regarding CT scan  Greetings Doctor Byrum, Will my upcoming ct scan include other lymph nodes or just lung nodes? If not, will I need other care team members to schedule additional node ct scans? Just trying to get the biggest bang for the buck.  Harvie Heck    Sending to Dr. Delton Coombes for recommendations

## 2020-06-16 NOTE — Telephone Encounter (Signed)
The Ct chest that we ordered will only look at the nodes in the chest - that were enlarged before. To my knowledge we haven;'t looked for nodes elsewhere, haven't had any signs that he has more wide spread nodal enlargement.

## 2020-06-17 ENCOUNTER — Other Ambulatory Visit: Payer: BC Managed Care – PPO

## 2020-06-23 ENCOUNTER — Telehealth: Payer: Self-pay | Admitting: Emergency Medicine

## 2020-06-23 NOTE — Telephone Encounter (Signed)
Spoke with patient. He stated that he had his CT done at [facility] this morning. He stated that the CT tech was confused since there were no labs associated with his scan. Then the tech stated that he did not need labs.   I advised him that we do have to order labs on patients when contrast is used and the patient has a history of kidney failure. Since he does not have any kidney failure, no labs were needed.   He verbalized understanding. Nothing further needed.

## 2020-07-08 ENCOUNTER — Ambulatory Visit (INDEPENDENT_AMBULATORY_CARE_PROVIDER_SITE_OTHER): Payer: BC Managed Care – PPO | Admitting: Emergency Medicine

## 2020-07-08 ENCOUNTER — Encounter: Payer: Self-pay | Admitting: Emergency Medicine

## 2020-07-08 ENCOUNTER — Other Ambulatory Visit: Payer: Self-pay

## 2020-07-08 DIAGNOSIS — R590 Localized enlarged lymph nodes: Secondary | ICD-10-CM | POA: Diagnosis not present

## 2020-07-08 NOTE — Patient Instructions (Signed)
Your CT scan of the chest from 06/23/2020 shows a slightly enlarged right-sided lymph node, probably stable to smaller than on your previous scan.  No other enlarged nodes seen.  No lung tissue abnormalities seen.  This is good news. We will plan to repeat your CT scan of the chest in October 2022 at [facility] to follow for stability Your bronchoscopy did not establish a diagnosis of sarcoidosis but agree that you are neurological findings, visual disturbance, cardiac findings may be related to sarcoid.  Agree with continuing to discuss possible anti-inflammatory therapy with neurology.  If further diagnostics are needed then at this time the only potential target for biopsy would be your right hilar lymph node. Follow Dr. Delton Coombes in October after your CT scan so that we can review the results together.

## 2020-07-08 NOTE — Assessment & Plan Note (Signed)
Your CT scan of the chest from 06/23/2020 shows a slightly enlarged right-sided lymph node, probably stable to smaller than on your previous scan.  No other enlarged nodes seen.  No lung tissue abnormalities seen.  This is good news. We will plan to repeat your CT scan of the chest in October 2022 at [facility] to follow for stability Your bronchoscopy did not establish a diagnosis of sarcoidosis but agree that you are neurological findings, visual disturbance, cardiac findings may be related to sarcoid.  Agree with continuing to discuss possible anti-inflammatory therapy with neurology.  If further diagnostics are needed then at this time the only potential target for biopsy would be your right hilar lymph node. Follow Dr. Hale Chalfin in October after your CT scan so that we can review the results together. 

## 2020-07-08 NOTE — Addendum Note (Signed)
Addended by: Velvet Bathe on: 07/08/2020 12:42 PM   Modules accepted: Orders

## 2020-07-08 NOTE — Progress Notes (Signed)
Subjective:    Patient ID: Jason Osborn, male    DOB: 06-27-1965, 55 y.o.   MRN: [id number]  HPI  ROV 05/31/20 --follow-up visit 55 year old gentleman whom I have seen for hilar mediastinal adenopathy.  He also has a history of bicuspid AV, heart block with a pacer, TIA.  He underwent bronchoscopy and EBUS 2/15.  Pathology did not show any granulomas, did show lymphocytes.  Culture information Showed negative smears, fungal negative, AFB still pending, normal bacterial flora.  Flow cytometry was not performed. He is seeing Dr Ronalee Red in Neuro at [facility] - he is presuming a dx of Neurosarcoidosis, and is planning to treat with anti-inflammatory regimen.   MDM: Reviewed neurology notes from [facility], Dr. Ronalee Red, 04/2020  ROV 07/08/20 --55 year old gentleman whom I have seen for bilateral hilar and mediastinal adenopathy.  He has a history of bicuspid AV, heart block with a pacer, TIA with some concern for left facial numbness and left arm heaviness that may be related to neurosarcoidosis.  He is seeing Dr. Ronalee Red with neurology at [facility], and also Neuro-optho 06/21/20.  Unifying diagnosis here would be sarcoidosis but he underwent endobronchial ultrasound 2/15 and nodal biopsy showed lymphocytes without any granulomas, flow cytometry was not done.  All culture data has been negative. Neurology still leaning towards treating with an anti-inflammatory regimen. He is also planning to see Rheumatology.   He still has weakness, unsteadiness, some visual disturbance when he moves his head quickly. Still with some exertional SOB, occasional cough most bothersome at night.   Repeat CT chest done 06/23/2020 reviewed by me shows clear tracheobronchial tree without any infiltrates or nodular abnormalities.  There is a 1.6 cm right hilar node without any other significant lymphadenopathy, stable to slightly smaller than his coronary CT 03/09/2020.  MDM: Reviewed neuro-ophthalmology notes from 06/21/2020  Review of  Systems As per HPI     Objective:   Physical Exam Vitals:   07/08/20 1150  BP: 138/78  Pulse: 84  Temp: (!) 97.3 F (36.3 C)  TempSrc: Temporal  SpO2: 99%  Weight: 232 lb 9.6 oz (105.5 kg)  Height: 6\' 1"  (1.854 m)   Gen: Pleasant, well-nourished, in no distress,  normal affect  ENT: No lesions,  mouth clear,  oropharynx clear, no postnasal drip  Neck: No JVD, no stridor  Lungs: No use of accessory muscles, no crackles or wheezing on normal respiration, no wheeze on forced expiration  Cardiovascular: RRR, heart sounds normal, no murmur or gallops, no peripheral edema  Musculoskeletal: No deformities, no cyanosis or clubbing.  Some point tenderness in the left anterior intercostal area without any palpable lesion  Neuro: alert, awake, answers appropriately.  Strength not tested  Skin: Warm, no lesions or rash      Assessment & Plan:  Hilar adenopathy Your CT scan of the chest from 06/23/2020 shows a slightly enlarged right-sided lymph node, probably stable to smaller than on your previous scan.  No other enlarged nodes seen.  No lung tissue abnormalities seen.  This is good news. We will plan to repeat your CT scan of the chest in October 2022 at [facility] to follow for stability Your bronchoscopy did not establish a diagnosis of sarcoidosis but agree that you are neurological findings, visual disturbance, cardiac findings may be related to sarcoid.  Agree with continuing to discuss possible anti-inflammatory therapy with neurology.  If further diagnostics are needed then at this time the only potential target for biopsy would be your right hilar lymph node. Follow  Dr. Delton Coombes in October after your CT scan so that we can review the results together.   Levy Pupa, MD, PhD 07/08/2020, 12:39 PM  Pulmonary and Critical Care [phone] or if no answer [phone]

## 2020-08-11 LAB — CUP PACEART REMOTE DEVICE CHECK
Battery Remaining Percentage: 60 %
Brady Statistic RA Percent Paced: 26 %
Brady Statistic RV Percent Paced: 0 %
Date Time Interrogation Session: 20220601081247
Implantable Lead Implant Date: 20160901
Implantable Lead Implant Date: 20160901
Implantable Lead Location: 753859
Implantable Lead Location: 753860
Implantable Lead Model: 377
Implantable Lead Model: 377
Implantable Lead Serial Number: 49234000
Implantable Lead Serial Number: 49288022
Implantable Pulse Generator Implant Date: 20160901
Lead Channel Impedance Value: 507 Ohm
Lead Channel Impedance Value: 585 Ohm
Lead Channel Pacing Threshold Amplitude: 1.2 V
Lead Channel Pacing Threshold Amplitude: 2 V
Lead Channel Pacing Threshold Pulse Width: 0.4 ms
Lead Channel Pacing Threshold Pulse Width: 0.4 ms
Lead Channel Sensing Intrinsic Amplitude: 7.1 mV
Lead Channel Sensing Intrinsic Amplitude: 7.6 mV
Lead Channel Setting Pacing Amplitude: 2.3 V
Lead Channel Setting Pacing Amplitude: 3 V
Lead Channel Setting Pacing Pulse Width: 0.4 ms
Pulse Gen Model: 394929
Pulse Gen Serial Number: 68576339

## 2020-08-12 ENCOUNTER — Ambulatory Visit (INDEPENDENT_AMBULATORY_CARE_PROVIDER_SITE_OTHER): Payer: BC Managed Care – PPO

## 2020-08-12 DIAGNOSIS — I4891 Unspecified atrial fibrillation: Secondary | ICD-10-CM | POA: Diagnosis not present

## 2020-09-06 NOTE — Progress Notes (Signed)
Remote pacemaker transmission.   

## 2020-11-11 ENCOUNTER — Ambulatory Visit (INDEPENDENT_AMBULATORY_CARE_PROVIDER_SITE_OTHER): Payer: BC Managed Care – PPO

## 2020-11-11 DIAGNOSIS — I4891 Unspecified atrial fibrillation: Secondary | ICD-10-CM | POA: Diagnosis not present

## 2020-11-16 LAB — CUP PACEART REMOTE DEVICE CHECK
Battery Remaining Percentage: 55 %
Brady Statistic RA Percent Paced: 29 %
Brady Statistic RV Percent Paced: 0 %
Date Time Interrogation Session: 20220901084032
Implantable Lead Implant Date: 20160901
Implantable Lead Implant Date: 20160901
Implantable Lead Location: 753859
Implantable Lead Location: 753860
Implantable Lead Model: 377
Implantable Lead Model: 377
Implantable Lead Serial Number: 49234000
Implantable Lead Serial Number: 49288022
Implantable Pulse Generator Implant Date: 20160901
Lead Channel Impedance Value: 488 Ohm
Lead Channel Impedance Value: 566 Ohm
Lead Channel Pacing Threshold Amplitude: 1.1 V
Lead Channel Pacing Threshold Amplitude: 2.1 V
Lead Channel Pacing Threshold Pulse Width: 0.4 ms
Lead Channel Pacing Threshold Pulse Width: 0.4 ms
Lead Channel Sensing Intrinsic Amplitude: 6.5 mV
Lead Channel Sensing Intrinsic Amplitude: 7.2 mV
Lead Channel Setting Pacing Amplitude: 2.3 V
Lead Channel Setting Pacing Amplitude: 3 V
Lead Channel Setting Pacing Pulse Width: 0.4 ms
Pulse Gen Model: 394929
Pulse Gen Serial Number: 68576339

## 2020-11-23 NOTE — Progress Notes (Signed)
Remote pacemaker transmission.   

## 2020-12-29 ENCOUNTER — Ambulatory Visit (INDEPENDENT_AMBULATORY_CARE_PROVIDER_SITE_OTHER)
Admission: RE | Admit: 2020-12-29 | Discharge: 2020-12-29 | Disposition: A | Discharge status: Home / Self Care | Payer: BC Managed Care – PPO | Source: Ambulatory Visit | Attending: Emergency Medicine | Admitting: Emergency Medicine

## 2020-12-29 ENCOUNTER — Other Ambulatory Visit: Payer: Self-pay

## 2020-12-29 DIAGNOSIS — R590 Localized enlarged lymph nodes: Secondary | ICD-10-CM

## 2021-02-01 ENCOUNTER — Encounter: Payer: Self-pay | Admitting: Internal Medicine

## 2021-02-01 MED ORDER — AMLODIPINE BESYLATE 5 MG PO TABS: 5.0000 mg | ORAL_TABLET | Freq: Every day | ORAL | 3 refills | Status: DC

## 2021-02-01 NOTE — Telephone Encounter (Signed)
Received reply from Dr. Tenny Craw:  Pt needs amlodipine refilled.  I would not change meds at this point.  He sent me a note.  Amlodipine 5 mg daily refilled to [facility].

## 2021-02-10 ENCOUNTER — Ambulatory Visit (INDEPENDENT_AMBULATORY_CARE_PROVIDER_SITE_OTHER): Payer: BC Managed Care – PPO

## 2021-02-10 DIAGNOSIS — I493 Ventricular premature depolarization: Secondary | ICD-10-CM

## 2021-02-10 LAB — CUP PACEART REMOTE DEVICE CHECK
Date Time Interrogation Session: 20221201093724
Implantable Lead Implant Date: 20160901
Implantable Lead Implant Date: 20160901
Implantable Lead Location: 753859
Implantable Lead Location: 753860
Implantable Lead Model: 377
Implantable Lead Model: 377
Implantable Lead Serial Number: 49234000
Implantable Lead Serial Number: 49288022
Implantable Pulse Generator Implant Date: 20160901
Pulse Gen Model: 394929
Pulse Gen Serial Number: 68576339

## 2021-02-15 ENCOUNTER — Ambulatory Visit (INDEPENDENT_AMBULATORY_CARE_PROVIDER_SITE_OTHER): Payer: BC Managed Care – PPO | Admitting: Internal Medicine

## 2021-02-15 ENCOUNTER — Other Ambulatory Visit: Payer: Self-pay

## 2021-02-15 ENCOUNTER — Encounter: Payer: Self-pay | Admitting: Internal Medicine

## 2021-02-15 VITALS — BP 158/82 | HR 79 | Ht 73.0 in | Wt 237.4 lb

## 2021-02-15 DIAGNOSIS — I4729 Other ventricular tachycardia: Secondary | ICD-10-CM | POA: Diagnosis not present

## 2021-02-15 DIAGNOSIS — Z950 Presence of cardiac pacemaker: Secondary | ICD-10-CM

## 2021-02-15 DIAGNOSIS — Z87898 Personal history of other specified conditions: Secondary | ICD-10-CM

## 2021-02-15 DIAGNOSIS — I4891 Unspecified atrial fibrillation: Secondary | ICD-10-CM | POA: Diagnosis not present

## 2021-02-15 DIAGNOSIS — I493 Ventricular premature depolarization: Secondary | ICD-10-CM | POA: Diagnosis not present

## 2021-02-15 DIAGNOSIS — R55 Syncope and collapse: Secondary | ICD-10-CM | POA: Diagnosis not present

## 2021-02-15 DIAGNOSIS — G909 Disorder of the autonomic nervous system, unspecified: Secondary | ICD-10-CM | POA: Diagnosis not present

## 2021-02-15 MED ORDER — LOSARTAN POTASSIUM 50 MG PO TABS: 50.0000 mg | ORAL_TABLET | Freq: Every day | ORAL | 3 refills | Status: DC

## 2021-02-15 NOTE — Progress Notes (Signed)
Patient Care Team: Corrington, Delsa Grana, MD as PCP - General (Family Medicine) Fay Records, MD as PCP - Cardiology (Cardiology) Alda Berthold, DO as Consulting Physician (Neurology)   HPI  Jason Osborn is a 55 y.o. male seen in follow up for syncope in the context of a previously implanted Biotronik pacemaker.  (9/16) undertaken because of tilt table test positivity.  Hx of Aortic valve repair and of his ascending aorta for bicuspid aortic valve  Hx of recurrent syncope with normal pacemaker function  Event recorder utilized.  Recurrent events.  Demonstrated sinus rhythm.  Heart rates were only about 100 suggesting that CLS was not sufficiently activated (oral)  Palpitations >>correlated with PVCs      Today,    he states that he feels about the same and experiences imballance and "blackouts." He finds himself falling down about once a month. He states that he could be laying down and he can "feel it" his eyes "go down." He reports that 10% the imballance occurs when laying down, but 20% they occur when he is upright.  When the "black outs" happen he could be standing and he would be "going down" and he'll have to pick himself up again. He could be walking at the store and he'll expereince dizziness. He has not had a CT scan, but he has had an MRI recently. He reports that he lost feeling in his body and his feet, but he still feels pain. He woke up last night from the device. The diagnosics of his device does not bother him. 50 is the lowest his heart rate can fall. He 128, 130 is his CLS rate.  He says that the amlonapine has not been helping with his hypertension. His blood pressure has been high near the upper 140s, and was 158 when he came in the clinic today.      DATE TEST EF    3/19 Stress MRI   Neg   10/19 LHC (CE)   No obstruc CAD  8/20 cMRI (CE)  76% LGE neg  12/21 CTA  Ca Score 0        Date Cr K Hgb  2/22 1.12 4.2        Records and Results Reviewed    Past Medical History:  Diagnosis Date   Aneurysm, ascending aorta 05/19/2014   Atrial fibrillation (HCC)    POST OPERATIVE   Atypical chest pain    NEGATIVE ADENOSINE CMR 05/23/17, NORMAL RIGHT AND LEFT HEART CATH AT [facility] ON 12/19/17   Bicuspid aortic valve    Cardiac pacemaker in situ    Chronic constipation    Dizziness    Elevated liver function tests    Essential hypertension    Fatty liver    Hypercholesteremia    Hyperlipidemia    Hypertension    Left elbow pain    Near syncope 05/18/2014   Neuropathy    Presence of permanent cardiac pacemaker    Sleep apnea    uses Cpap   Syncope 05/18/2014   CARDIOINHIBITOR SYNCOPE S/P PPM S/P DISRUPTION OF ATRIAL LEAD FROM ABOVE SURGICAL PROCEDURE S/P LASER LEAD EXTRACTION/VISION 10/11 AT [facility]   Tendinopathy of elbow    Thoracic aortic aneurysm    S/P 28MM ASCENDING HEMIARCH WITH AV REPAIR AT [facility] ON 12/20/17    Past Surgical History:  Procedure Laterality Date   BRONCHIAL NEEDLE ASPIRATION BIOPSY  04/27/2020   Procedure: BRONCHIAL NEEDLE ASPIRATION BIOPSIES;  Surgeon: Baltazar Apo  S, MD;  Location: [facility] ENDOSCOPY;  Service: Pulmonary;;   BRONCHIAL WASHINGS  04/27/2020   Procedure: BRONCHIAL WASHINGS;  Surgeon: Leslye Peer, MD;  Location: [facility] ENDOSCOPY;  Service: Pulmonary;;   COLONOSCOPY     KNEE ARTHROSCOPY  2004, 1986   PACEMAKER INSERTION     THORACIC AORTIC ANEURYSM REPAIR  2019   VIDEO BRONCHOSCOPY WITH ENDOBRONCHIAL ULTRASOUND Bilateral 04/27/2020   Procedure: VIDEO BRONCHOSCOPY WITH ENDOBRONCHIAL ULTRASOUND;  Surgeon: Leslye Peer, MD;  Location: [facility] ENDOSCOPY;  Service: Pulmonary;  Laterality: Bilateral;    Current Meds  Medication Sig   amLODipine (NORVASC) 5 MG tablet Take 1 tablet (5 mg total) by mouth daily.   aspirin EC 81 MG tablet Take 81 mg by mouth daily.   lamoTRIgine (LAMICTAL) 25 MG tablet Take 25 mg by mouth daily.   magnesium oxide (MAG-OX) 400 MG tablet Take 400 mg by mouth in the morning and at  bedtime.   Multiple Vitamin (MULTIVITAMIN WITH MINERALS) TABS tablet Take 1 tablet by mouth daily.   nortriptyline (PAMELOR) 10 MG capsule Take 20 mg by mouth daily in the afternoon.   nortriptyline (PAMELOR) 50 MG capsule Take 50 mg by mouth at bedtime.   pregabalin (LYRICA) 100 MG capsule Take 100 mg by mouth in the morning, at noon, in the evening, and at bedtime.   rosuvastatin (CRESTOR) 20 MG tablet Take 20 mg by mouth every evening.   sildenafil (VIAGRA) 50 MG tablet Take 50 mg by mouth daily as needed for erectile dysfunction.   venlafaxine (EFFEXOR) 75 MG tablet Take 75 mg by mouth in the morning and at bedtime. (in the morning & at night)    Allergies  Allergen Reactions   Amantadine Other (See Comments)    Nausea, low blood pressure Nausea, low blood pressure    Atenolol Other (See Comments)    Able to tolerate in small doses (body aches)   Atorvastatin Rash and Other (See Comments)    Frequent urination Body Aches    Pyridostigmine Rash and Other (See Comments)    Body Aches *able to take in small doses Extremity weakness    Simvastatin Rash and Other (See Comments)    Body aches /Increased urination         Physical Exam BP (!) 158/82   Pulse 79   Ht 6\' 1"  (1.854 m)   Wt 237 lb 6.4 oz (107.7 kg)   SpO2 97%   BMI 31.32 kg/m   Well developed and well nourished in no acute distress HENT normal Neck supple with JVP-flat Clear Device pocket well healed; without hematoma or erythema.  There is no tethering  Regular rate and rhythm, no  gallop No  murmur Abd-soft with active BS No Clubbing cyanosis  edema Skin-warm and dry A & Oriented  Grossly normal sensory and motor function    Sinus @@ 79 19/12/45   Assessment and  Plan   Syncope   Dysautonomia   Small fiber neuropathy   Pacemaker-Biotronik   Ascending aortic aneurysm status post repair   Bicuspid aortic valve  VT-nonsustained  PVCs  Continues with recurrent falls and syncope, he  think that they are distinct.  Reviewed physiology of neurally mediated reflexes and the role of CLS pacing  Encouraged ongoing neuro eval for unifying diagnosis  With BP elevated will add losartan with target BP of about 130 initially, I worry about the nadir of a fall if BP is too "normal     21/12/45 as  a scribe for Virl Axe, MD.,have documented all relevant documentation on the behalf of Virl Axe, MD,as directed by  Virl Axe, MD while in the presence of Virl Axe, MD.   I, Virl Axe, MD, have reviewed all documentation for this visit. The documentation on 02/15/21 for the exam, diagnosis, procedures, and orders are all accurate and complete.

## 2021-02-15 NOTE — Patient Instructions (Addendum)
Medication Instructions:  Your physician has recommended you make the following change in your medication:   Begin Losartan 50mg  - 1 tablet by mouth daily.  *If you need a refill on your cardiac medications before your next appointment, please call your pharmacy*   Lab Work: BMET in 2 weeks If you have labs (blood work) drawn today and your tests are completely normal, you will receive your results only by: MyChart Message (if you have MyChart) OR A paper copy in the mail If you have any lab test that is abnormal or we need to change your treatment, we will call you to review the results.   Testing/Procedures: None ordered.    Follow-Up: At [facility], you and your health needs are our priority.  As part of our continuing mission to provide you with exceptional heart care, we have created designated Provider Care Teams.  These Care Teams include your primary Cardiologist (physician) and Advanced Practice Providers (APPs -  Physician Assistants and Nurse Practitioners) who all work together to provide you with the care you need, when you need it.  We recommend signing up for the patient portal called "MyChart".  Sign up information is provided on this After Visit Summary.  MyChart is used to connect with patients for Virtual Visits (Telemedicine).  Patients are able to view lab/test results, encounter notes, upcoming appointments, etc.  Non-urgent messages can be sent to your provider as well.   To learn more about what you can do with MyChart, go to [facility].    Your next appointment:   12 months with Dr [email or web address]

## 2021-02-17 LAB — CUP PACEART INCLINIC DEVICE CHECK
Date Time Interrogation Session: 20221206141700
Implantable Lead Implant Date: 20160901
Implantable Lead Implant Date: 20160901
Implantable Lead Location: 753859
Implantable Lead Location: 753860
Implantable Lead Model: 377
Implantable Lead Model: 377
Implantable Lead Serial Number: 49234000
Implantable Lead Serial Number: 49288022
Implantable Pulse Generator Implant Date: 20160901
Lead Channel Impedance Value: 526 Ohm
Lead Channel Impedance Value: 604 Ohm
Lead Channel Pacing Threshold Amplitude: 1.1 V
Lead Channel Pacing Threshold Amplitude: 1.2 V
Lead Channel Pacing Threshold Amplitude: 1.2 V
Lead Channel Pacing Threshold Amplitude: 1.9 V
Lead Channel Pacing Threshold Amplitude: 2 V
Lead Channel Pacing Threshold Amplitude: 2 V
Lead Channel Pacing Threshold Pulse Width: 0.4 ms
Lead Channel Pacing Threshold Pulse Width: 0.4 ms
Lead Channel Pacing Threshold Pulse Width: 0.4 ms
Lead Channel Pacing Threshold Pulse Width: 0.4 ms
Lead Channel Pacing Threshold Pulse Width: 0.4 ms
Lead Channel Pacing Threshold Pulse Width: 0.4 ms
Lead Channel Sensing Intrinsic Amplitude: 8.4 mV
Lead Channel Sensing Intrinsic Amplitude: 8.5 mV
Lead Channel Sensing Intrinsic Amplitude: 8.5 mV
Lead Channel Sensing Intrinsic Amplitude: 8.6 mV
Lead Channel Setting Pacing Amplitude: 2.1 V
Lead Channel Setting Pacing Amplitude: 2.9 V
Lead Channel Setting Pacing Pulse Width: 0.4 ms
Pulse Gen Model: 394929
Pulse Gen Serial Number: 68576339

## 2021-02-22 NOTE — Progress Notes (Signed)
Remote pacemaker transmission.   

## 2021-03-03 ENCOUNTER — Other Ambulatory Visit: Payer: BC Managed Care – PPO

## 2021-03-08 ENCOUNTER — Other Ambulatory Visit: Payer: BC Managed Care – PPO | Admitting: *Deleted

## 2021-03-08 ENCOUNTER — Other Ambulatory Visit: Payer: Self-pay

## 2021-03-08 DIAGNOSIS — G909 Disorder of the autonomic nervous system, unspecified: Secondary | ICD-10-CM

## 2021-03-08 DIAGNOSIS — I4729 Other ventricular tachycardia: Secondary | ICD-10-CM

## 2021-03-08 DIAGNOSIS — Z950 Presence of cardiac pacemaker: Secondary | ICD-10-CM

## 2021-03-08 DIAGNOSIS — I493 Ventricular premature depolarization: Secondary | ICD-10-CM

## 2021-03-08 DIAGNOSIS — I4891 Unspecified atrial fibrillation: Secondary | ICD-10-CM

## 2021-03-08 LAB — BASIC METABOLIC PANEL
BUN/Creatinine Ratio: 13 (ref 9–20)
BUN: 14 mg/dL (ref 6–24)
CO2: 25 mmol/L (ref 20–29)
Calcium: 9.5 mg/dL (ref 8.7–10.2)
Chloride: 103 mmol/L (ref 96–106)
Creatinine, Ser: 1.05 mg/dL (ref 0.76–1.27)
Glucose: 97 mg/dL (ref 70–99)
Potassium: 4.9 mmol/L (ref 3.5–5.2)
Sodium: 141 mmol/L (ref 134–144)
eGFR: 84 mL/min/{1.73_m2} (ref >59)

## 2021-04-12 NOTE — Progress Notes (Addendum)
Cardiology Office Note   Date:  04/13/2021   ID:  Jason Osborn, DOB 20-Jan-1966, MRN [id number]  PCP:  Vivien Presto, MD  Cardiologist:   Dietrich Pates, MD    Patient presens for F/U of dizziness, syncope   History of Present Illness: Jason Osborn is a 56 y.o. male with a history of bicuspid aortic valve, thoracic aortic aneurysm - s/p repair at [facility] in October 2019. Had post of AF. Was on amiodarone for a short time.    Other history includes PPM since 11/2014 for syncope and heart block noted on prior tilt table testing - had lead revision in 12/2017. Noted POTS as well and neuropathy. Has had care thru the [facility]  The pt had a near syncpal spell in March 2020  Seen by cardiology in ED   Recomm hydration.   . The pt was seen by Iona Hansen in September 2021  Set up for event monitor  THis showed SR   PVCs that were sensed   NSVT (asymptomatic)  His CLS was reprogrammed to increase rate from 120 -140.      In Dec 2021 the pt was seen twice in [facility] ED with chest pain - and heaviness/ numbness in the L face- He has no history of CAD - LHC in 2019 showed no CAD. CT of the head was negative (done on return visit). CXR was negative. He did not stay and elected to leave. magnesium  In Dec 2021 had dizziness and L sided Chest pain/pressure. CT coronary angiogram done   Ca score 0   Did have lymphadenopathy noted in lungs    Referred to pulmonary       He was last seen in cardiology by Odessa Fleming in Dec 2022  Follows [facility] PPM   At that visit patient noted falling and "blackouts"    The pt says he has been doing about the same   Dizzy at times    Still with blackout spells   Erratic  Meds adjusted by Odessa Fleming in Dec   Taken off amlodipine   On Cozaar 50     Pt says his weight is up since change on meds this fall  Trying to lose  He does note a cough at night   Wakes him up   Denies bitter taste in mouth       Current Meds  Medication Sig    amLODipine (NORVASC) 5 MG tablet Take 1 tablet (5 mg total) by mouth daily.   aspirin EC 81 MG tablet Take 81 mg by mouth daily.   lamoTRIgine (LAMICTAL) 25 MG tablet Take 25 mg by mouth daily.   losartan (COZAAR) 50 MG tablet Take 1 tablet (50 mg total) by mouth daily.   magnesium oxide (MAG-OX) 400 MG tablet Take 400 mg by mouth in the morning and at bedtime.   Multiple Vitamin (MULTIVITAMIN WITH MINERALS) TABS tablet Take 1 tablet by mouth daily.   nortriptyline (PAMELOR) 10 MG capsule Take 20 mg by mouth daily in the afternoon.   nortriptyline (PAMELOR) 50 MG capsule Take 50 mg by mouth at bedtime.   pregabalin (LYRICA) 100 MG capsule Take 100 mg by mouth in the morning, at noon, in the evening, and at bedtime.   rosuvastatin (CRESTOR) 20 MG tablet Take 20 mg by mouth every evening.   sildenafil (VIAGRA) 50 MG tablet Take 50 mg by mouth daily as needed for erectile dysfunction.  venlafaxine (EFFEXOR) 75 MG tablet Take 75 mg by mouth in the morning and at bedtime. (in the morning & at night)     Allergies:   Amantadine, Atenolol, Atorvastatin, Pyridostigmine, and Simvastatin   Past Medical History:  Diagnosis Date   Aneurysm, ascending aorta 05/19/2014   Atrial fibrillation (HCC)    POST OPERATIVE   Atypical chest pain    NEGATIVE ADENOSINE CMR 05/23/17, NORMAL RIGHT AND LEFT HEART CATH AT [facility] ON 12/19/17   Bicuspid aortic valve    Cardiac pacemaker in situ    Chronic constipation    Dizziness    Elevated liver function tests    Essential hypertension    Fatty liver    Hypercholesteremia    Hyperlipidemia    Hypertension    Left elbow pain    Near syncope 05/18/2014   Neuropathy    Presence of permanent cardiac pacemaker    Sleep apnea    uses Cpap   Syncope 05/18/2014   CARDIOINHIBITOR SYNCOPE S/P PPM S/P DISRUPTION OF ATRIAL LEAD FROM ABOVE SURGICAL PROCEDURE S/P LASER LEAD EXTRACTION/VISION 10/11 AT [facility]   Tendinopathy of elbow    Thoracic aortic aneurysm    S/P  ASCENDING HEMIARCH WITH AV REPAIR AT [facility] ON 12/20/17    Past Surgical History:  Procedure Laterality Date   BRONCHIAL NEEDLE ASPIRATION BIOPSY  04/27/2020   Procedure: BRONCHIAL NEEDLE ASPIRATION BIOPSIES;  Surgeon: Leslye Peer, MD;  Location: [facility] ENDOSCOPY;  Service: Pulmonary;;   BRONCHIAL WASHINGS  04/27/2020   Procedure: BRONCHIAL WASHINGS;  Surgeon: Leslye Peer, MD;  Location: [facility] ENDOSCOPY;  Service: Pulmonary;;   COLONOSCOPY     KNEE ARTHROSCOPY  2004, 1986   PACEMAKER INSERTION     THORACIC AORTIC ANEURYSM REPAIR  2019   VIDEO BRONCHOSCOPY WITH ENDOBRONCHIAL ULTRASOUND Bilateral 04/27/2020   Procedure: VIDEO BRONCHOSCOPY WITH ENDOBRONCHIAL ULTRASOUND;  Surgeon: Leslye Peer, MD;  Location: [facility] ENDOSCOPY;  Service: Pulmonary;  Laterality: Bilateral;     Social History:  The patient  reports that he has been smoking cigars. He has never used smokeless tobacco. He reports current alcohol use. He reports that he does not use drugs.   Family History:  The patient's family history includes Healthy in his mother.    ROS:  Please see the history of present illness. All other systems are reviewed and  Negative to the above problem except as noted.    PHYSICAL EXAM: VS:  BP 140/70 (BP Location: Left Arm, Patient Position: Sitting, Cuff Size: Normal)    Pulse 98    Resp 20    Ht 6\' 1"  (1.854 m)    Wt 242 lb (109.8 kg)    SpO2 99%    BMI 31.93 kg/m     GEN:  Pt is an obese 56 yo, in no acute distress  HEENT: normal  Neck: no JVD, not carotid bruits Cardiac: RRR; no murmurs,   No LE  edema  Respiratory:  clear to auscultation bilaterally GI: soft, nontender, nondistended, + BS MS: no deformity Moving all extremities   Skin: warm and dry, no rash Neuro:  Strength and sensation are intact Psych: euthymic mood, full affect   EKG:  EKG is ordered today.  SR 79 bpm       Lipid Panel No results found for: CHOL, TRIG, HDL, CHOLHDL, VLDL, LDLCALC, LDLDIRECT    Wt Readings  from Last 3 Encounters:  04/13/21 242 lb (109.8 kg)  02/15/21 237 lb 6.4 oz (107.7 kg)  07/08/20 232 lb 9.6 oz (105.5 kg)      ASSESSMENT AND PLAN:  1/  Syncope   Still with recurrent spells despite PPM     2  Neuro  Hx dysautonomia and small fiber neuropathy     Seen a mult institutions   Uniying dx not made  3 Hx of CHB   S/P PPM ([facility])  4  Hx ascending aortic aneurysm   S/p Repair  2019  Followed at [facility]  5  H xBicuspid AV  6  Hx PVCs,  NSVT  7  HTN   BP is fair.  Thold him to follow  8  Cough    I ques if reflux since occurs when laying flat   may be exacerbated by change in wt.   Recomm sleeping with head up   Alos trial of Pepcid     Will follow up in August     Current medicines are reviewed at length with the patient today.  The patient does not have concerns regarding medicines.  Signed, Dietrich PatesPaula Campbell Agramonte, MD  04/13/2021 11:09 AM    [facility] [address], [place], [place]  [address] Phone: [phone]; Fax: [phone]

## 2021-04-13 ENCOUNTER — Encounter: Payer: Self-pay | Admitting: Internal Medicine

## 2021-04-13 ENCOUNTER — Other Ambulatory Visit: Payer: Self-pay

## 2021-04-13 ENCOUNTER — Ambulatory Visit: Payer: BC Managed Care – PPO | Admitting: Internal Medicine

## 2021-04-13 VITALS — BP 140/70 | HR 98 | Resp 20 | Ht 73.0 in | Wt 242.0 lb

## 2021-04-13 DIAGNOSIS — R55 Syncope and collapse: Secondary | ICD-10-CM | POA: Diagnosis not present

## 2021-04-13 NOTE — Patient Instructions (Signed)

## 2021-05-12 ENCOUNTER — Ambulatory Visit (INDEPENDENT_AMBULATORY_CARE_PROVIDER_SITE_OTHER): Payer: BC Managed Care – PPO

## 2021-05-12 DIAGNOSIS — R55 Syncope and collapse: Secondary | ICD-10-CM

## 2021-05-13 LAB — CUP PACEART REMOTE DEVICE CHECK
Date Time Interrogation Session: 20230302100855
Implantable Lead Implant Date: 20160901
Implantable Lead Implant Date: 20160901
Implantable Lead Location: 753859
Implantable Lead Location: 753860
Implantable Lead Model: 377
Implantable Lead Model: 377
Implantable Lead Serial Number: 49234000
Implantable Lead Serial Number: 49288022
Implantable Pulse Generator Implant Date: 20160901
Pulse Gen Model: 394929
Pulse Gen Serial Number: 68576339

## 2021-05-19 NOTE — Progress Notes (Signed)
Remote pacemaker transmission.   

## 2021-07-03 IMAGING — CT CT HEAD W/O CM
4 series · 15 of 47 positions shown, 17 images · non-contrast
Comparison: Head CT dated 05/18/2014.

CLINICAL DATA: 54-year-old male with neurologic deficit.

EXAM:
CT HEAD WITHOUT CONTRAST
TECHNIQUE: Contiguous axial images were obtained from the base of the skull
through the vertex without intravenous contrast.

[Series 3: head wo · axial · 0.52mm/px · z∈[+1272,+1397]mm · 7 of 35 slices shown, 9 images]
[im 5/35  brain]
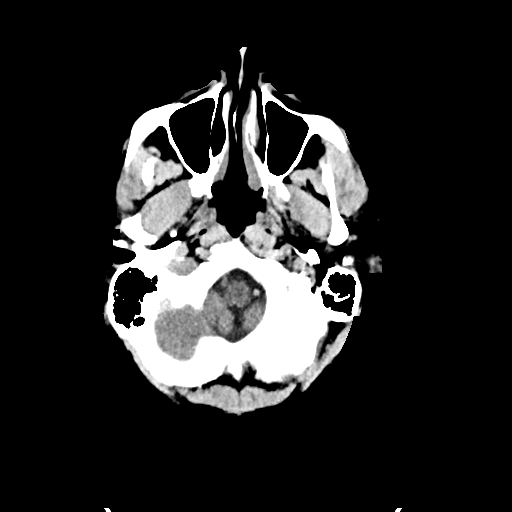
[im 5/35  bone]
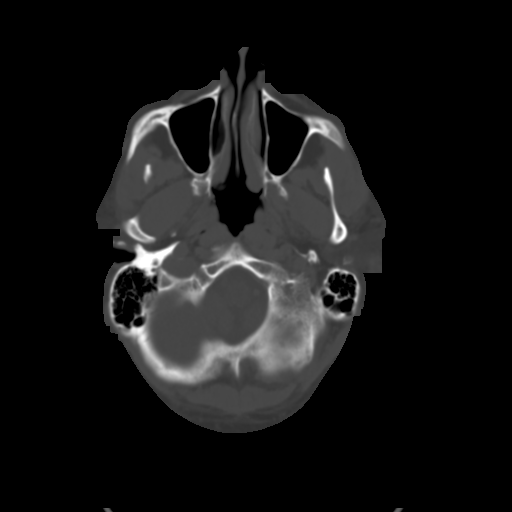
[im 9/35  brain]
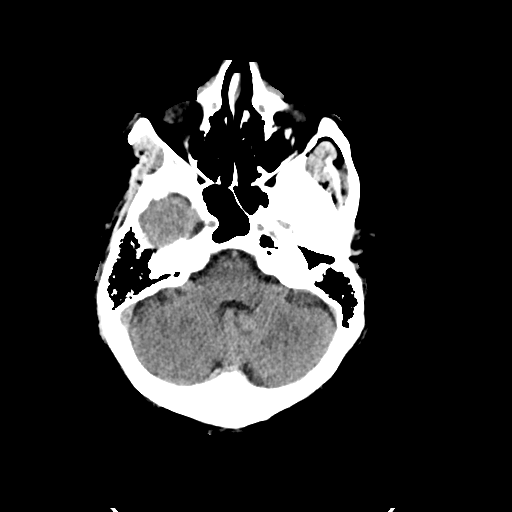
[im 13/35  brain]
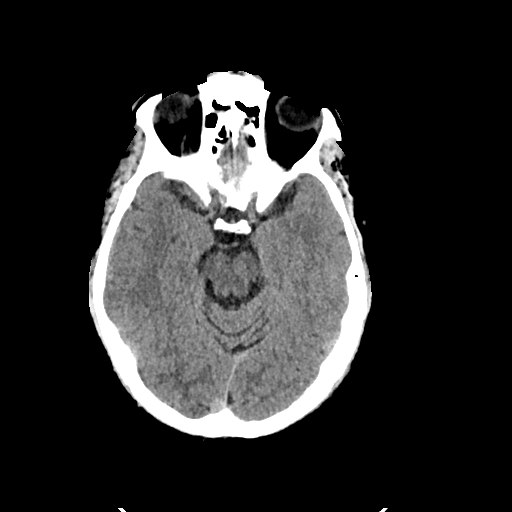
[im 18/35  brain]
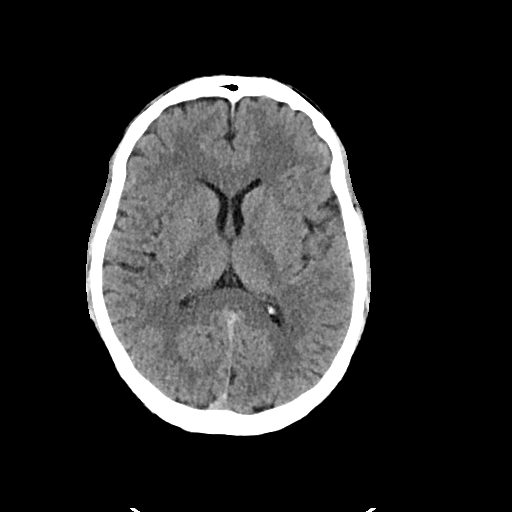
[im 22/35  brain]
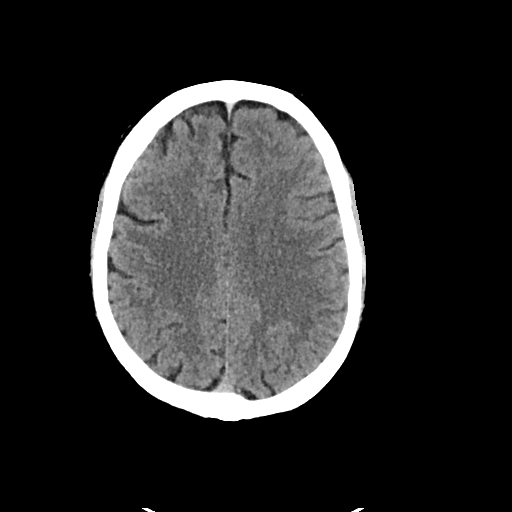
[im 22/35  bone]
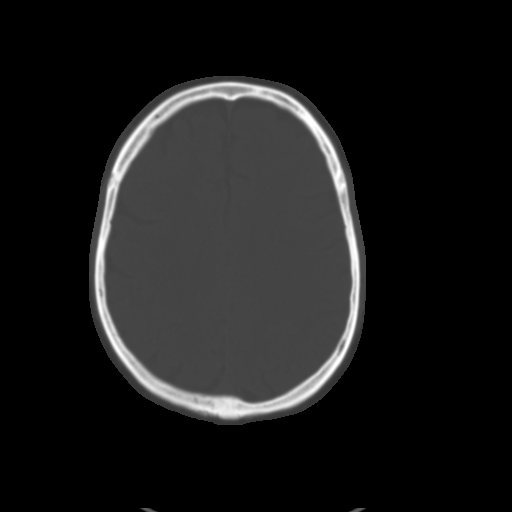
[im 26/35  brain]
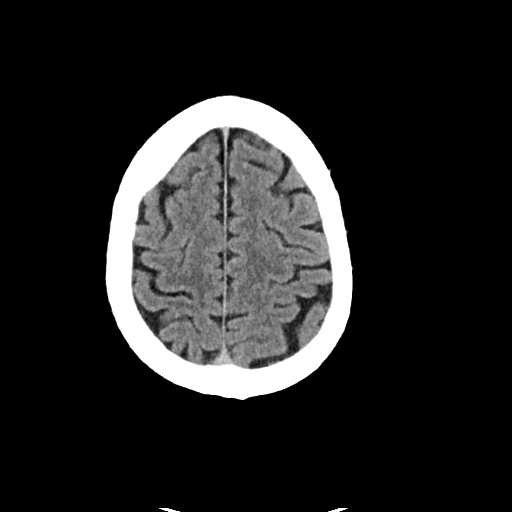
[im 30/35  brain]
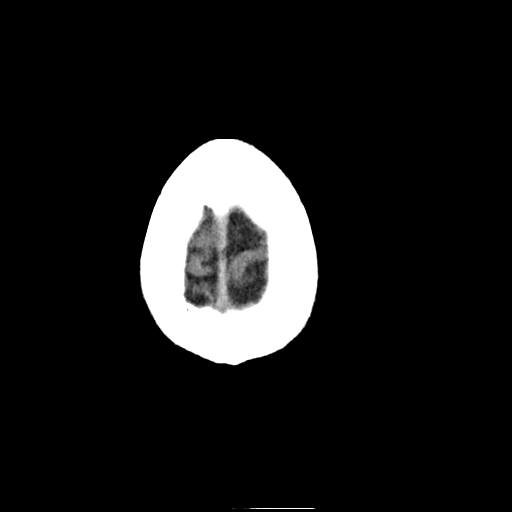

[Series 4: head bone · axial · 0.52mm/px · z∈[+1268,+1286]mm · 2 of 87 slices shown]
[im 9/87  bone]
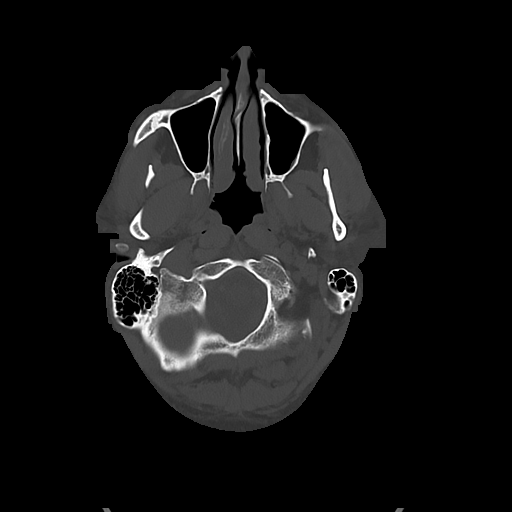
[im 18/87  bone]
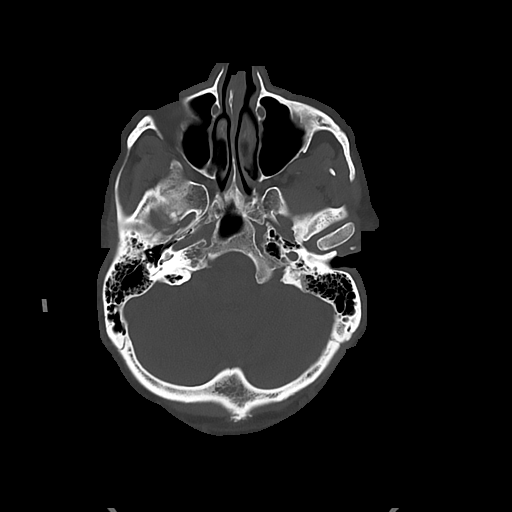

[Series 5: cor soft · coronal · 0.33mm/px · 3 of 92 slices shown]
[im 31/92  brain]
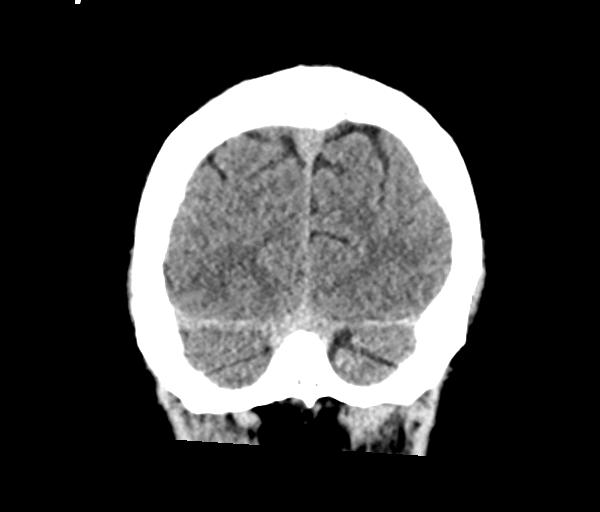
[im 41/92  brain]
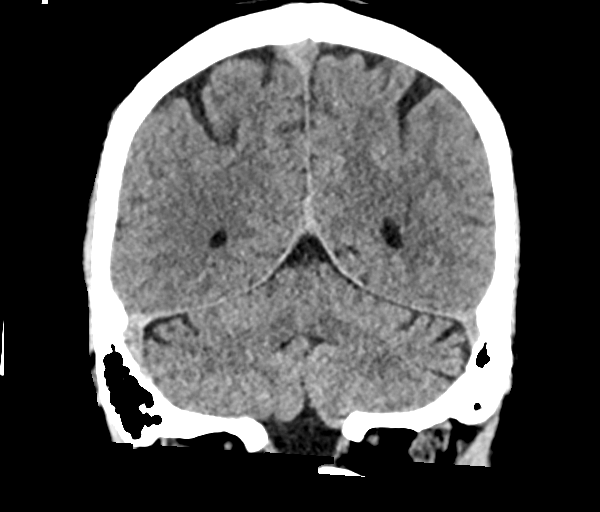
[im 51/92  brain]
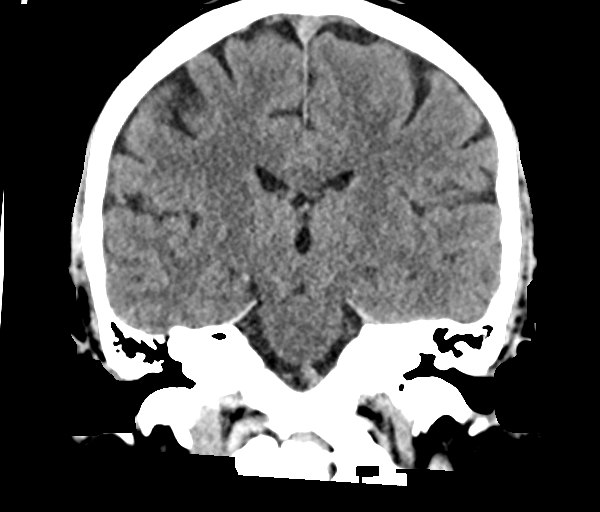

[Series 6: sag soft · sagittal · 0.33mm/px · 3 of 66 slices shown]
[im 22/66  brain]
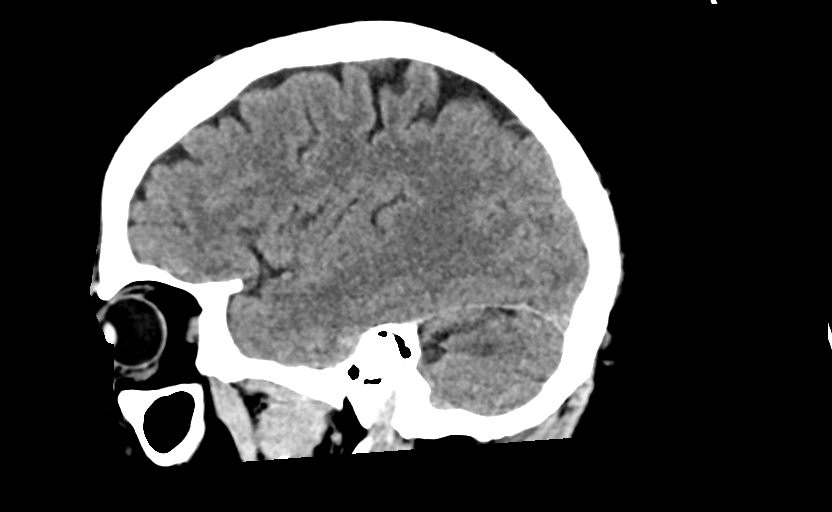
[im 33/66  brain]
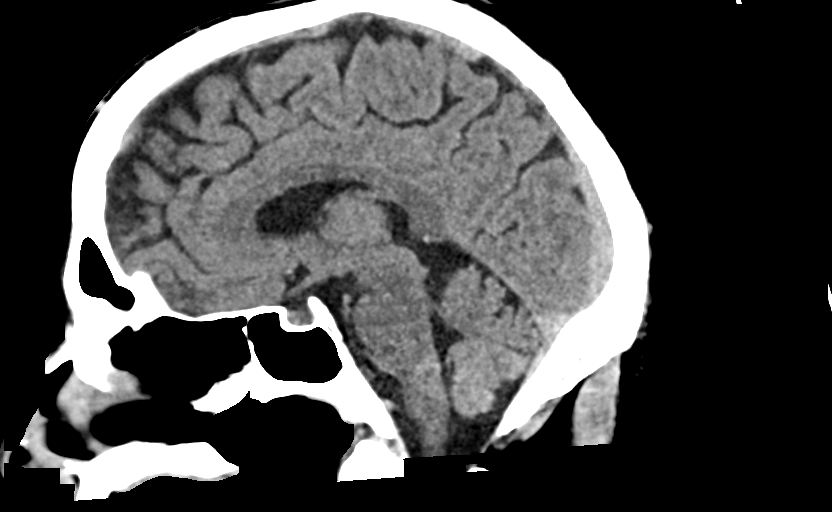
[im 44/66  brain]
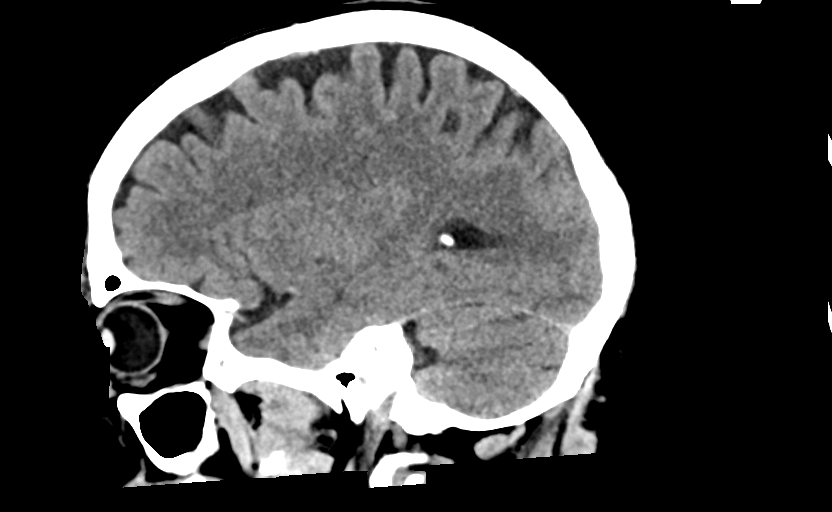

[15 of 47 positions shown; findings below may reference images not displayed]

FINDINGS: Brain: The ventricles and sulci appropriate size for patient's age.
The gray-white matter discrimination is preserved. There is no acute
intracranial hemorrhage. No mass effect or midline shift. No
extra-axial fluid collection.

Vascular: No hyperdense vessel or unexpected calcification.

Skull: Normal. Negative for fracture or focal lesion.

Sinuses/Orbits: There is mild diffuse mucoperiosteal thickening of
paranasal sinuses. No air-fluid level. The mastoid air cells are
clear.

Other: None
IMPRESSION: Unremarkable noncontrast CT of the brain.

## 2021-08-11 ENCOUNTER — Ambulatory Visit (INDEPENDENT_AMBULATORY_CARE_PROVIDER_SITE_OTHER): Payer: BC Managed Care – PPO

## 2021-08-11 DIAGNOSIS — I4729 Other ventricular tachycardia: Secondary | ICD-10-CM

## 2021-08-11 LAB — CUP PACEART REMOTE DEVICE CHECK
Battery Remaining Percentage: 50 %
Brady Statistic RA Percent Paced: 22 %
Brady Statistic RV Percent Paced: 0 %
Date Time Interrogation Session: 20230601131943
Implantable Lead Implant Date: 20160901
Implantable Lead Implant Date: 20160901
Implantable Lead Location: 753859
Implantable Lead Location: 753860
Implantable Lead Model: 377
Implantable Lead Model: 377
Implantable Lead Serial Number: 49234000
Implantable Lead Serial Number: 49288022
Implantable Pulse Generator Implant Date: 20160901
Lead Channel Impedance Value: 488 Ohm
Lead Channel Impedance Value: 566 Ohm
Lead Channel Pacing Threshold Amplitude: 1.3 V
Lead Channel Pacing Threshold Amplitude: 1.9 V
Lead Channel Pacing Threshold Pulse Width: 0.4 ms
Lead Channel Pacing Threshold Pulse Width: 0.4 ms
Lead Channel Sensing Intrinsic Amplitude: 6.3 mV
Lead Channel Sensing Intrinsic Amplitude: 7.2 mV
Lead Channel Setting Pacing Amplitude: 2.1 V
Lead Channel Setting Pacing Amplitude: 2.9 V
Lead Channel Setting Pacing Pulse Width: 0.4 ms
Pulse Gen Model: 394929
Pulse Gen Serial Number: 68576339

## 2021-08-19 NOTE — Progress Notes (Signed)
Remote pacemaker transmission.   

## 2021-11-01 ENCOUNTER — Other Ambulatory Visit: Payer: Self-pay

## 2021-11-01 ENCOUNTER — Other Ambulatory Visit: Payer: Self-pay | Admitting: Internal Medicine

## 2021-11-10 ENCOUNTER — Ambulatory Visit (INDEPENDENT_AMBULATORY_CARE_PROVIDER_SITE_OTHER): Payer: Managed Care, Other (non HMO)

## 2021-11-10 DIAGNOSIS — I4729 Other ventricular tachycardia: Secondary | ICD-10-CM

## 2021-11-10 LAB — CUP PACEART REMOTE DEVICE CHECK
Date Time Interrogation Session: 20230831061204
Implantable Lead Implant Date: 20160901
Implantable Lead Implant Date: 20160901
Implantable Lead Location: 753859
Implantable Lead Location: 753860
Implantable Lead Model: 377
Implantable Lead Model: 377
Implantable Lead Serial Number: 49234000
Implantable Lead Serial Number: 49288022
Implantable Pulse Generator Implant Date: 20160901
Pulse Gen Model: 394929
Pulse Gen Serial Number: 68576339

## 2021-12-01 NOTE — Progress Notes (Signed)
Remote pacemaker transmission.   

## 2022-02-09 ENCOUNTER — Ambulatory Visit (INDEPENDENT_AMBULATORY_CARE_PROVIDER_SITE_OTHER): Payer: BC Managed Care – PPO

## 2022-02-09 DIAGNOSIS — I4891 Unspecified atrial fibrillation: Secondary | ICD-10-CM

## 2022-02-10 LAB — CUP PACEART REMOTE DEVICE CHECK
Date Time Interrogation Session: 20231130074231
Implantable Lead Connection Status: 753985
Implantable Lead Connection Status: 753985
Implantable Lead Implant Date: 20160901
Implantable Lead Implant Date: 20160901
Implantable Lead Location: 753859
Implantable Lead Location: 753860
Implantable Lead Model: 377
Implantable Lead Model: 377
Implantable Lead Serial Number: 49234000
Implantable Lead Serial Number: 49288022
Implantable Pulse Generator Implant Date: 20160901
Pulse Gen Model: 394929
Pulse Gen Serial Number: 68576339

## 2022-02-19 NOTE — Progress Notes (Deleted)
Cardiology Office Note   Date:  02/19/2022   ID:  Jason Osborn, DOB November 30, 1965, MRN [id number]  PCP:  Vivien Presto, MD  Cardiologist:   Dietrich Pates, MD    Patient presens for F/U of dizziness, syncope   History of Present Illness: Jason Osborn is a 56 y.o. male with a history of bicuspid aortic valve, thoracic aortic aneurysm - s/p repair at [facility] in October 2019. Had post of AF. Was on amiodarone for a short time.    Other history includes PPM since 11/2014 for syncope and heart block noted on prior tilt table testing - had lead revision in 12/2017. Noted POTS as well and neuropathy. [facility] care thru [facility]  The pt had a near syncpal spell in March 2020  Seen by cardiology in ED   Recomm hydration.   . The pt was seen by Iona Hansen in September 2021  Set up for event monitor  THis showed SR   PVCs that were sensed   NSVT (asymptomatic)  His CLS was reprogrammed to increase rate from 120 -140.      In Dec 2021 the pt was seen twice in [facility] ED with chest pain - and heaviness/ numbness in the L face- He has no history of CAD - LHC in 2019 showed no CAD. CT of the head was negative (done on return visit). CXR was negative. He did not stay and elected to leave. magnesium  In Dec 2021 had dizziness and L sided Chest pain/pressure. CT coronary angiogram done   Ca score 0   Did have lymphadenopathy noted in lungs    Referred to pulmonary       He was last seen in cardiology by Odessa Fleming in Dec 2022  Follows [facility] PPM   At that visit patient noted falling and "blackouts"    The pt says he has been doing about the same   Dizzy at times    Still with blackout spells   Erratic  Meds adjusted by Odessa Fleming in Dec   Taken off amlodipine   On Cozaar 50     Pt says his weight is up since change on meds this fall  Trying to lose  He does note a cough at night   Wakes him up   Denies bitter taste in mouth     I saw the pt in Feb 2023     No  outpatient medications have been marked as taking for the 02/21/22 encounter (Appointment) with Pricilla Riffle, MD.     Allergies:   Amantadine, Atenolol, Atorvastatin, Pyridostigmine, and Simvastatin   Past Medical History:  Diagnosis Date   Aneurysm, ascending aorta 05/19/2014   Atrial fibrillation (HCC)    POST OPERATIVE   Atypical chest pain    NEGATIVE ADENOSINE CMR 05/23/17, NORMAL RIGHT AND LEFT HEART CATH AT [facility] ON 12/19/17   Bicuspid aortic valve    Cardiac pacemaker in situ    Chronic constipation    Dizziness    Elevated liver function tests    Essential hypertension    Fatty liver    Hypercholesteremia    Hyperlipidemia    Hypertension    Left elbow pain    Near syncope 05/18/2014   Neuropathy    Presence of permanent cardiac pacemaker    Sleep apnea    uses Cpap   Syncope 05/18/2014   CARDIOINHIBITOR SYNCOPE S/P PPM S/P DISRUPTION OF ATRIAL  LEAD FROM ABOVE SURGICAL PROCEDURE S/P LASER LEAD EXTRACTION/VISION 10/11 AT [facility]   Tendinopathy of elbow    Thoracic aortic aneurysm    S/P ASCENDING HEMIARCH WITH AV REPAIR AT [facility] ON 12/20/17    Past Surgical History:  Procedure Laterality Date   BRONCHIAL NEEDLE ASPIRATION BIOPSY  04/27/2020   Procedure: BRONCHIAL NEEDLE ASPIRATION BIOPSIES;  Surgeon: Leslye Peer, MD;  Location: [facility] ENDOSCOPY;  Service: Pulmonary;;   BRONCHIAL WASHINGS  04/27/2020   Procedure: BRONCHIAL WASHINGS;  Surgeon: Leslye Peer, MD;  Location: [facility] ENDOSCOPY;  Service: Pulmonary;;   COLONOSCOPY     KNEE ARTHROSCOPY  2004, 1986   PACEMAKER INSERTION     THORACIC AORTIC ANEURYSM REPAIR  2019   VIDEO BRONCHOSCOPY WITH ENDOBRONCHIAL ULTRASOUND Bilateral 04/27/2020   Procedure: VIDEO BRONCHOSCOPY WITH ENDOBRONCHIAL ULTRASOUND;  Surgeon: Leslye Peer, MD;  Location: [facility] ENDOSCOPY;  Service: Pulmonary;  Laterality: Bilateral;     Social History:  The patient  reports that he has been smoking cigars. He has never used smokeless tobacco. He  reports current alcohol use. He reports that he does not use drugs.   Family History:  The patient's family history includes Healthy in his mother.    ROS:  Please see the history of present illness. All other systems are reviewed and  Negative to the above problem except as noted.    PHYSICAL EXAM: VS:  There were no vitals taken for this visit.    GEN:  Pt is an obese 56 yo, in no acute distress  HEENT: normal  Neck: no JVD, not carotid bruits Cardiac: RRR; no murmurs,   No LE  edema  Respiratory:  clear to auscultation bilaterally GI: soft, nontender, nondistended, + BS MS: no deformity Moving all extremities   Skin: warm and dry, no rash Neuro:  Strength and sensation are intact Psych: euthymic mood, full affect   EKG:  EKG is ordered today.  SR 79 bpm       Lipid Panel No results found for: "CHOL", "TRIG", "HDL", "CHOLHDL", "VLDL", "LDLCALC", "LDLDIRECT"    Wt Readings from Last 3 Encounters:  04/13/21 242 lb (109.8 kg)  02/15/21 237 lb 6.4 oz (107.7 kg)  07/08/20 232 lb 9.6 oz (105.5 kg)      ASSESSMENT AND PLAN:  1/  Syncope   Still with recurrent spells despite PPM     2  Neuro  Hx dysautonomia and small fiber neuropathy     Seen a mult institutions   Uniying dx not made  3 Hx of CHB   S/P PPM ([facility])  4  Hx ascending aortic aneurysm   S/p Repair  2019  Followed at [facility]  5  H xBicuspid AV  6  Hx PVCs,  NSVT  7  HTN   BP is fair.  Thold him to follow  8  Cough    I ques if reflux since occurs when laying flat   may be exacerbated by change in wt.   Recomm sleeping with head up   Alos trial of Pepcid     Will follow up in August     Current medicines are reviewed at length with the patient today.  The patient does not have concerns regarding medicines.  Signed, Dietrich Pates, MD  02/19/2022 10:07 PM    [facility] [address] [place], [place], [place]  [address] Phone: [phone]; Fax: [phone]

## 2022-02-21 ENCOUNTER — Ambulatory Visit: Payer: Managed Care, Other (non HMO) | Admitting: Internal Medicine

## 2022-03-01 NOTE — Progress Notes (Signed)
Remote pacemaker transmission.   

## 2022-03-29 ENCOUNTER — Ambulatory Visit: Payer: 59 | Admitting: Internal Medicine

## 2022-03-31 ENCOUNTER — Encounter: Payer: Self-pay | Admitting: Internal Medicine

## 2022-03-31 ENCOUNTER — Ambulatory Visit: Payer: 59 | Attending: Internal Medicine | Admitting: Internal Medicine

## 2022-03-31 VITALS — BP 147/92 | HR 88 | Ht 73.0 in | Wt 245.6 lb

## 2022-03-31 DIAGNOSIS — I10 Essential (primary) hypertension: Secondary | ICD-10-CM

## 2022-03-31 DIAGNOSIS — I4891 Unspecified atrial fibrillation: Secondary | ICD-10-CM

## 2022-03-31 DIAGNOSIS — I4729 Other ventricular tachycardia: Secondary | ICD-10-CM

## 2022-03-31 MED ORDER — LOSARTAN POTASSIUM 100 MG PO TABS: 100.0000 mg | ORAL_TABLET | Freq: Every day | ORAL | 3 refills | Status: DC

## 2022-03-31 NOTE — Progress Notes (Signed)
Cardiology Office Note   Date:  03/31/2022   ID:  Jason Osborn, DOB May 14, 1965, MRN [id number]  PCP:  Curly Rim, MD  Cardiologist:   Dorris Carnes, MD    Patient presens for F/U of dizziness, syncope   History of Present Illness: Jason Osborn is a 57 y.o. male with a history of bicuspid aortic valve, thoracic aortic aneurysm - s/p repair at [facility] in October 2019. Had post of AF. Was on amiodarone for a short time.    Other history includes PPM since 11/2014 for syncope and heart block noted on prior tilt table testing - had lead revision in 12/2017. Noted POTS as well and neuropathy. Has had care thru the [facility] and at [facility]  The pt had a near syncpal spell in March 2020  Seen by cardiology in ED   Recomm hydration.   . The pt was seen by Antionette Fairy in September 2021  Set up for event monitor  THis showed SR   PVCs that were sensed   NSVT (asymptomatic)  His CLS was reprogrammed to increase rate from 120 -140.      In Dec 2021 the pt was seen twice in [facility] ED with chest pain - and heaviness/ numbness in the L face- He has no history of CAD - LHC in 2019 showed no CAD. CT of the head was negative (done on return visit). CXR was negative. He did not stay and elected to leave. magnesium  In Dec 2021 had dizziness and L sided Chest pain/pressure. CT coronary angiogram done   Ca score 0   Did have lymphadenopathy noted in lungs    Referred to pulmonary     The pt continued to follow with Olin Pia for medicine adjustments (off amlodipine, added Cozaar)   Also folllowed in Device clinic   I saw the tp in Feb 2023      Since seen he has also been seen at [facility] (neuro ) for small fiber neuropathy and management post thoracic transverse myelitis.    He was seen in Dec 2023   Doing fairly well at the time    The pt says he is doing fairly well   Does have dizziness but he is managing   Denies CP   Breathing is OK BP has been running higher          Current  Meds  Medication Sig   aspirin EC 81 MG tablet Take 81 mg by mouth daily.   losartan (COZAAR) 50 MG tablet Take 1 tablet (50 mg total) by mouth daily.   nortriptyline (PAMELOR) 25 MG capsule Take 25 mg by mouth at bedtime.   nortriptyline (PAMELOR) 50 MG capsule Take 50 mg by mouth at bedtime.   OXcarbazepine (TRILEPTAL) 150 MG tablet Take 300 mg by mouth 2 (two) times daily.   pregabalin (LYRICA) 100 MG capsule Take 100 mg by mouth in the morning, at noon, in the evening, and at bedtime.   rosuvastatin (CRESTOR) 20 MG tablet Take 20 mg by mouth every evening.   sildenafil (VIAGRA) 50 MG tablet Take 50 mg by mouth daily as needed for erectile dysfunction.   venlafaxine (EFFEXOR) 75 MG tablet Take 75 mg by mouth in the morning and at bedtime. (in the morning & at night)     Allergies:   Amantadine, Atenolol, Atorvastatin, Pyridostigmine, and Simvastatin   Past Medical History:  Diagnosis Date   Aneurysm, ascending aorta ([place]) 05/19/2014  Atrial fibrillation (HCC)    POST OPERATIVE   Atypical chest pain    NEGATIVE ADENOSINE CMR 05/23/17, NORMAL RIGHT AND LEFT HEART CATH AT [facility] ON 12/19/17   Bicuspid aortic valve    Cardiac pacemaker in situ    Chronic constipation    Dizziness    Elevated liver function tests    Essential hypertension    Fatty liver    Hypercholesteremia    Hyperlipidemia    Hypertension    Left elbow pain    Near syncope 05/18/2014   Neuropathy    Presence of permanent cardiac pacemaker    Sleep apnea    uses Cpap   Syncope 05/18/2014   CARDIOINHIBITOR SYNCOPE S/P PPM S/P DISRUPTION OF ATRIAL LEAD FROM ABOVE SURGICAL PROCEDURE S/P LASER LEAD EXTRACTION/VISION 10/11 AT [facility]   Tendinopathy of elbow    Thoracic aortic aneurysm (HCC)    S/P ASCENDING HEMIARCH WITH AV REPAIR AT [facility] ON 12/20/17    Past Surgical History:  Procedure Laterality Date   BRONCHIAL NEEDLE ASPIRATION BIOPSY  04/27/2020   Procedure: BRONCHIAL NEEDLE ASPIRATION BIOPSIES;  Surgeon:  Leslye Peer, MD;  Location: [facility] ENDOSCOPY;  Service: Pulmonary;;   BRONCHIAL WASHINGS  04/27/2020   Procedure: BRONCHIAL WASHINGS;  Surgeon: Leslye Peer, MD;  Location: [facility] ENDOSCOPY;  Service: Pulmonary;;   COLONOSCOPY     KNEE ARTHROSCOPY  2004, 1986   PACEMAKER INSERTION     THORACIC AORTIC ANEURYSM REPAIR  2019   VIDEO BRONCHOSCOPY WITH ENDOBRONCHIAL ULTRASOUND Bilateral 04/27/2020   Procedure: VIDEO BRONCHOSCOPY WITH ENDOBRONCHIAL ULTRASOUND;  Surgeon: Leslye Peer, MD;  Location: [facility] ENDOSCOPY;  Service: Pulmonary;  Laterality: Bilateral;     Social History:  The patient  reports that he has been smoking cigars. He has never used smokeless tobacco. He reports current alcohol use. He reports that he does not use drugs.   Family History:  The patient's family history includes Healthy in his mother.    ROS:  Please see the history of present illness. All other systems are reviewed and  Negative to the above problem except as noted.    PHYSICAL EXAM: VS:  BP (!) 147/92   Pulse 88   Ht 6\' 1"  (1.854 m)   Wt 245 lb 9.6 oz (111.4 kg)   SpO2 95%   BMI 32.40 kg/m     GEN:  Pt is in no acute distress  HEENT: normal  Neck: no JVD, not carotid bruit Cardiac: RRR; no murmur    Triv  edema  Respiratory:  clear to auscultation GI: soft, nontender, nondistended, + BS MS: no deformity Moving all extremities   Skin: warm and dry, no rash Neuro:  CN II to XII intact Psych: euthymic mood, full affect   EKG:  EKG is not ordered    Cardiac MRI ([facility]) ardiac MRI  1. The left ventricle is normal in cavity size and wall thickness. Global systolic function is hyperdynamic  with an LV ejection fraction calculated at 62%. There are no regional wall motion abnormalities.   2. The right ventricle is normal in cavity size, wall thickness, and systolic function. A RV lead is  inserted into the distal RV septum.   3. Both atria are normal in size. The RA lead inserts into the RA  appendage.   4. The aortic valve is bicuspid in morphology. The patient is status post aortic valve repair (raphe  shaving/debridement of conjoint cusp). There is no significant aortic stenosis.  Peak  aortic valve velocity  measures 1.8 m/s (estimated peak gradient 12 mm Hg; estimated mean gradient 6 mm Hg). There is a central  jet of mild-moderate aortic regurgitation.   5.  There is trivial mitral regurgitation, trivial tricuspid regurgitation, and trivial pulmonic  regurgitation.   6. Delayed enhancement MRI for viability is normal. There is no evidence of myocardial infarction,  scarring, or infiltration.   7. There is no evidence of an intracardiac thrombus.   8.  The pericardium is normal in thickness.  There is no significant pericardial effusion.   Thoracic MRA  1.  The patient is status post ascending aortic and hemi-arch repair. The aortic root measures 3.8 x 3.6  cm. The ascending aortic repair and arch repair are normal in size.  The descending thoracic aorta is  normal in size.  There is no evidence of an aortic dissection flap, coarctation, or patent ductus  arteriosus.   MRA bi-orthogonal luminal aortic dimensions:  Sinuses of Valsalva: 3.8 x 3.6 cm (3.9 x 3.7 cm)  Sinotubular junction: 3.0 x 2.9 cm (3.0 x 2.9 cm)  Asc. aorta (at level of PA bifurcation; below bend): 3.0 x 3.0 cm (3.1 x 3.0 cm)  Prox. arch (prior to the right innominate artery): 2.9 x 2.9 cm (2.9 x 2.9 cm)  Distal arch (after the left subclavian artery): 2.5 x 2.1 cm  (2.5 x 2.2 cm)  Descending thoracic aorta (at level of PA bifurcation): 2.7 x 2.6 cm (2.7 x 2.5 cm)  Descending aorta (diaphragm): 2.5 x 2.4 cm (2.4 x 2.3 cm)   Compared to the prior scan, there have been no significant changes in luminal diameters.   2.  The aortic arch is left sided. There is normal branching of the arch vessels.   3.  The main and proximal branch pulmonary arteries are normal in size.   4.  There are normal  systemic and pulmonary venous connections.    CT coronary angiogram  2021  FINDINGS: Coronary calcium score: The patient's coronary artery calcium score is 0, which places the patient in the 0 percentile.   Coronary arteries: Normal coronary origins. Right dominance. [place] are on native sinotubular portion of aortic root (not reimplanted).   Right Coronary Artery: Normal caliber vessel, gives rise to PDA. No significant plaque or stenosis.   Left Main Coronary Artery: Normal caliber vessel. No significant plaque or stenosis. Large ramus intermedius without plaque or stenosis.   Left Anterior Descending Coronary Artery: Normal caliber vessel. No significant plaque or stenosis.   Left Circumflex Artery: Normal caliber vessel. No significant plaque or stenosis.   Aortic Valve and Ascending Aorta:   Bicuspid valve   S/P aortic valve repair (raphe shaving/debridement of conjoint cusp, Sievers 1) and ascending aorta/hemiarch repair 12/20/17 (28 mm ascending Dacron hemiarch graft and 28 mm Dacron graft from sinotubular junction to arch graft). I cannot see post-surgical images, but report reviewed from [facility] dated 10/12/2018 for comparison.   Other findings:   Normal pulmonary vein drainage into the left atrium.   Normal left atrial appendage without a thrombus.   Normal size of the pulmonary artery.   Dual chamber pacemaker with leads terminating in the RA and RV.   IMPRESSION: 1. No evidence of CAD, CADRADS = 0. Coronary ostia are on native aortic root; Dacron grafts are distal to the coronary ostia.   2. Coronary calcium score of 0. This was 0 percentile for age and sex matched control.   3. Normal coronary  origin with right dominance.   4. History of bicuspid aortic valve and ascending aortic aneurysm S/P aortic valve repair (raphe shaving/debridement of conjoint cusp, Sievers 1) and ascending aorta/hemiarch repair 12/20/17 (28 mm ascending Dacron  hemiarch graft and 28 mm Dacron graft from sinotubular junction to arch graft). I cannot see post-surgical images, but report reviewed from [facility] dated 10/12/2018 for comparison. There was an area of concern about 12 mm distal to the sewing ring of the aortic root graft. I reviewed the images with Dr. Weber Cooks, and he felt this was postsurgical and not dissection.   5. Lymphadenopathy as noted, recommend clinical correlation and consider repeat imaging.    Lipid Panel No results found for: "CHOL", "TRIG", "HDL", "CHOLHDL", "VLDL", "LDLCALC", "LDLDIRECT"    Wt Readings from Last 3 Encounters:  03/31/22 245 lb 9.6 oz (111.4 kg)  04/13/21 242 lb (109.8 kg)  02/15/21 237 lb 6.4 oz (107.7 kg)      ASSESSMENT AND PLAN:  1 Neuro   Autonomic dysfunction/ dizziness/syncope     Patient is doing fairly well at present   Will try to increase losartan for better BP control but may need to hold back if sympotms worsen Continue to follow at [facility]   2 Hx HTN    BP elevated   Would recomm increase in losartan to 100 mg  I would not expect that drastic change in BP    Follow   Get BMET in 2 wks  3Hx  CHB   s/p PPM (Biotronic)  Follows with EP/Device clinic   4  Asc aortic aneurysm   s/p repair at [facility] 2019  Followed   MRI in 2022  5  Hx bicuspid AV   MRI at [facility] in 2022     Mean gradient estimated at 6 mm Hg   Follow  6  Hx PVCs / NSVT   Pt denies palpitations    Plan for follow up in clinic in 1 year   MyChart with BP response   He has several other providers who will also be involved in care to see in interim.    Current medicines are reviewed at length with the patient today.  The patient does not have concerns regarding medicines.  Signed, Dorris Carnes, MD  03/31/2022 11:10 AM    [address], [place], [place]  [address] Phone: [phone]; Fax: [phone]

## 2022-03-31 NOTE — Patient Instructions (Signed)
Medication Instructions:  INCREASE LOSARTAN TO 100 MG DAILY  *If you need a refill on your cardiac medications before your next appointment, please call your pharmacy*   Lab Work: BMET AND HGBA1C IN 2 WEEKS  If you have labs (blood work) drawn today and your tests are completely normal, you will receive your results only by: [place] (if you have MyChart) OR A paper copy in the mail If you have any lab test that is abnormal or we need to change your treatment, we will call you to review the results.   Testing/Procedures: NONE   Follow-Up: At [facility], you and your health needs are our priority.  As part of our continuing mission to provide you with exceptional heart care, we have created designated Provider Care Teams.  These Care Teams include your primary Cardiologist (physician) and Advanced Practice Providers (APPs -  Physician Assistants and Nurse Practitioners) who all work together to provide you with the care you need, when you need it.  We recommend signing up for the patient portal called "MyChart".  Sign up information is provided on this After Visit Summary.  MyChart is used to connect with patients for Virtual Visits (Telemedicine).  Patients are able to view lab/test results, encounter notes, upcoming appointments, etc.  Non-urgent messages can be sent to your provider as well.   To learn more about what you can do with MyChart, go to [email or web address].    Your next appointment:   1 year(s)  Provider:   Dorris Carnes, MD     Other Instructions

## 2022-04-05 ENCOUNTER — Encounter: Payer: Self-pay | Admitting: Internal Medicine

## 2022-04-14 ENCOUNTER — Ambulatory Visit: Payer: 59 | Attending: Internal Medicine

## 2022-04-14 DIAGNOSIS — I10 Essential (primary) hypertension: Secondary | ICD-10-CM

## 2022-04-14 DIAGNOSIS — I4729 Other ventricular tachycardia: Secondary | ICD-10-CM

## 2022-04-14 DIAGNOSIS — I4891 Unspecified atrial fibrillation: Secondary | ICD-10-CM

## 2022-04-15 LAB — BASIC METABOLIC PANEL
BUN/Creatinine Ratio: 11 (ref 9–20)
BUN: 15 mg/dL (ref 6–24)
CO2: 25 mmol/L (ref 20–29)
Calcium: 9.9 mg/dL (ref 8.7–10.2)
Chloride: 102 mmol/L (ref 96–106)
Creatinine, Ser: 1.34 mg/dL — ABNORMAL HIGH (ref 0.76–1.27)
Glucose: 145 mg/dL — ABNORMAL HIGH (ref 70–99)
Potassium: 4.7 mmol/L (ref 3.5–5.2)
Sodium: 141 mmol/L (ref 134–144)
eGFR: 62 mL/min/{1.73_m2} (ref >59)

## 2022-04-15 LAB — HEMOGLOBIN A1C
Est. average glucose Bld gHb Est-mCnc: 120 mg/dL
Hgb A1c MFr Bld: 5.8 % — ABNORMAL HIGH (ref 4.8–5.6)

## 2022-04-17 ENCOUNTER — Other Ambulatory Visit: Payer: Self-pay

## 2022-04-17 DIAGNOSIS — Z79899 Other long term (current) drug therapy: Secondary | ICD-10-CM

## 2022-04-17 DIAGNOSIS — R7989 Other specified abnormal findings of blood chemistry: Secondary | ICD-10-CM

## 2022-04-17 NOTE — Progress Notes (Signed)
Bmet 

## 2022-04-30 NOTE — Progress Notes (Unsigned)
Cardiology Office Note Date:  05/01/2022  Patient ID:  Jason Osborn, DOB [birth year], MRN [id number] PCP:  Curly Rim, MD  Cardiologist:  Dorris Carnes, MD Electrophysiologist: Virl Axe, MD    Chief Complaint: 1 year PPM follow-up  History of Present Illness: Jason Osborn is a 57 y.o. male with PMH notable for syncope s/p PPM, bicuspid aorta valve s/p AVR, ascending aoric aneurysm s/p repair, afib (post-operatively), NSVT, PVCs, syncope, dysautonomia, as; seen today for Virl Axe, MD for routine electrophysiology followup.  Last saw Dr. Caryl Comes 02/2021, HTN in office and at home > losartan added. Having frequent "blackouts", ongoing neuro eval. Saw Dr. Harrington Challenger 03/2022, cont HTN, losartan further increased to 144m.  He continues to check his BP at home. He did not tolerate 1068mdaily of losartan, just felt wiped out. Self-lowered back to 5016maily and is tolerating that dosage well, but BPs at home usually run in 140s.   He continues to have "blackout" periods. Follows regularly with neurology.     he denies chest pain, palpitations, dyspnea, PND, orthopnea, nausea, vomiting, dizziness, edema, weight gain, or early satiety.     Device Information: Biotronik dual chamber PPM,  dx syncope  Past Medical History:  Diagnosis Date   Aneurysm, ascending aorta ([place]/10/2014   Atrial fibrillation (HCC)    POST OPERATIVE   Atypical chest pain    NEGATIVE ADENOSINE CMR 05/23/17, NORMAL RIGHT AND LEFT HEART CATH AT [facility] ON 12/19/17   Bicuspid aortic valve    Cardiac pacemaker in situ    Chronic constipation    Dizziness    Elevated liver function tests    Essential hypertension    Fatty liver    Hypercholesteremia    Hyperlipidemia    Hypertension    Left elbow pain    Near syncope 05/18/2014   Neuropathy    Presence of permanent cardiac pacemaker    Sleep apnea    uses Cpap   Syncope 05/18/2014   CARDIOINHIBITOR SYNCOPE S/P PPM S/P DISRUPTION OF ATRIAL LEAD FROM ABOVE  SURGICAL PROCEDURE S/P LASER LEAD EXTRACTION/VISION 10/11 AT [facility]   Tendinopathy of elbow    Thoracic aortic aneurysm (HCC)    S/P 28MM ASCENDING HEMIARCH WITH AV REPAIR AT [facility] ON 12/20/17    Past Surgical History:  Procedure Laterality Date   BRONCHIAL NEEDLE ASPIRATION BIOPSY  04/27/2020   Procedure: BRONCHIAL NEEDLE ASPIRATION BIOPSIES;  Surgeon: ByrCollene GobbleD;  [facility]  Service: Pulmonary;;   BRONCHIAL WASHINGS  04/27/2020   Procedure: BRONCHIAL WASHINGS;  Surgeon: ByrCollene GobbleD;  [facility];  Service: Pulmonary;;   COLONOSCOPY     KNEE ARTHROSCOPY  2004, 1986   PACEMAKER INSERTION     THORACIC AORTIC ANEURYSM REPAIR  2019   VIDEO BRONCHOSCOPY WITH ENDOBRONCHIAL ULTRASOUND Bilateral 04/27/2020   Procedure: VIDEO BRONCHOSCOPY WITH ENDOBRONCHIAL ULTRASOUND;  Surgeon: ByrCollene GobbleD;  [facility];  Service: Pulmonary;  Laterality: Bilateral;    Current Outpatient Medications  Medication Instructions   aspirin EC 81 mg, Oral, Daily   losartan (COZAAR) 100 mg, Oral, Daily   nortriptyline (PAMELOR) 50 mg, Oral, Daily at bedtime   nortriptyline (PAMELOR) 25 mg, Oral, Daily at bedtime   OXcarbazepine (TRILEPTAL) 300 mg, Oral, 2 times daily   pregabalin (LYRICA) 100 mg, Oral, 4 times daily   rosuvastatin (CRESTOR) 20 mg, Oral, Every evening   sildenafil (VIAGRA) 50 mg, Oral, Daily PRN   venlafaxine (EFFEXOR) 75 mg, Oral, 2  times daily, (in the morning & at night)    Social History:  The patient  reports that he has been smoking cigars. He has never used smokeless tobacco. He reports current alcohol use. He reports that he does not use drugs.    ROS:  Please see the history of present illness. All other systems are reviewed and otherwise negative.   PHYSICAL EXAM:  VS:  BP 128/72   Pulse 81   Ht 6' 1"$  (1.854 m)   Wt 243 lb 12.8 oz (110.6 kg)   SpO2 96%   BMI 32.17 kg/m  BMI: Body mass index is 32.17 kg/m.  GEN- The  patient is well appearing, alert and oriented x 3 today.   HEENT: normocephalic, atraumatic; sclera clear, conjunctiva pink; hearing intact; oropharynx clear; neck supple, no JVP Lungs- Clear to ausculation bilaterally, normal work of breathing.  No wheezes, rales, rhonchi Heart- Regular rate and rhythm, no murmurs, rubs or gallops, PMI not laterally displaced GI- soft, non-tender, non-distended, bowel sounds present, no hepatosplenomegaly Extremities- No peripheral edema. no clubbing or cyanosis; DP/PT/radial pulses 2+ bilaterally MS- no significant deformity or atrophy Skin- warm and dry, no rash or lesion, device pocket well-healed Psych- euthymic mood, full affect Neuro- strength and sensation are intact   Device interrogation done today and reviewed by myself:  Battery good Lead thresholds, impedence, sensing stable  No episodes No changes made today  EKG is not ordered.   Recent Labs: 04/14/2022: BUN 15; Creatinine, Ser 1.34; Potassium 4.7; Sodium 141  No results found for requested labs within last 365 days.   Estimated Creatinine Clearance: 80.3 mL/min (A) (by C-G formula based on SCr of 1.34 mg/dL (H)).   Wt Readings from Last 3 Encounters:  05/01/22 243 lb 12.8 oz (110.6 kg)  03/31/22 245 lb 9.6 oz (111.4 kg)  04/13/21 242 lb (109.8 kg)     Additional studies reviewed include: Previous EP, cardiology notes.     ASSESSMENT AND PLAN:  #) syncope s/p PPM Device functioning well, see paceart for details No changes to device  #) NSTV #) PVCs No burden currently  #) HTN Well-controlled in office, but patient states readings at home are typically higher Neurology and gen cards are actively following and managing Will defer medication adjustments to them at this time    Current medicines are reviewed at length with the patient today.   The patient does not have concerns regarding his medicines.  The following changes were made today:  none  Labs/ tests ordered  today include:  No orders of the defined types were placed in this encounter.    Disposition: Follow up with Dr. Caryl Comes or EP APP in in 12 months   Signed, Mamie Levers, NP  05/01/22  11:36 AM  Electrophysiology [facility]

## 2022-05-01 ENCOUNTER — Ambulatory Visit: Payer: 59 | Attending: Student | Admitting: Cardiology

## 2022-05-01 ENCOUNTER — Encounter: Payer: Self-pay | Admitting: Student

## 2022-05-01 VITALS — BP 128/72 | HR 81 | Ht 73.0 in | Wt 243.8 lb

## 2022-05-01 DIAGNOSIS — I4729 Other ventricular tachycardia: Secondary | ICD-10-CM | POA: Diagnosis not present

## 2022-05-01 DIAGNOSIS — I493 Ventricular premature depolarization: Secondary | ICD-10-CM | POA: Diagnosis not present

## 2022-05-01 DIAGNOSIS — Z950 Presence of cardiac pacemaker: Secondary | ICD-10-CM | POA: Diagnosis not present

## 2022-05-01 DIAGNOSIS — R55 Syncope and collapse: Secondary | ICD-10-CM

## 2022-05-01 DIAGNOSIS — I10 Essential (primary) hypertension: Secondary | ICD-10-CM

## 2022-05-01 LAB — CUP PACEART INCLINIC DEVICE CHECK
Date Time Interrogation Session: 20240219121939
Implantable Lead Connection Status: 753985
Implantable Lead Connection Status: 753985
Implantable Lead Implant Date: 20160901
Implantable Lead Implant Date: 20160901
Implantable Lead Location: 753859
Implantable Lead Location: 753860
Implantable Lead Model: 377
Implantable Lead Model: 377
Implantable Lead Serial Number: 49234000
Implantable Lead Serial Number: 49288022
Implantable Pulse Generator Implant Date: 20160901
Pulse Gen Model: 394929
Pulse Gen Serial Number: 68576339

## 2022-05-01 NOTE — Patient Instructions (Signed)
Medication Instructions:  Your physician recommends that you continue on your current medications as directed. Please refer to the Current Medication list given to you today.  *If you need a refill on your cardiac medications before your next appointment, please call your pharmacy*   Lab Work: None If you have labs (blood work) drawn today and your tests are completely normal, you will receive your results only by: [place] (if you have MyChart) OR A paper copy in the mail If you have any lab test that is abnormal or we need to change your treatment, we will call you to review the results.   Follow-Up: At [facility], you and your health needs are our priority.  As part of our continuing mission to provide you with exceptional heart care, we have created designated Provider Care Teams.  These Care Teams include your primary Cardiologist (physician) and Advanced Practice Providers (APPs -  Physician Assistants and Nurse Practitioners) who all work together to provide you with the care you need, when you need it.   Your next appointment:   1 year(s)  Provider:   You may see Virl Axe, MD or one of the following Advanced Practice Providers on your designated Care Team:   Tommye Standard, [place] "Jonni Sanger" [place], [place], NP

## 2022-05-11 ENCOUNTER — Ambulatory Visit: Payer: 59

## 2022-05-11 DIAGNOSIS — R55 Syncope and collapse: Secondary | ICD-10-CM

## 2022-05-11 LAB — CUP PACEART REMOTE DEVICE CHECK
Date Time Interrogation Session: 20240229075210
Implantable Lead Connection Status: 753985
Implantable Lead Connection Status: 753985
Implantable Lead Implant Date: 20160901
Implantable Lead Implant Date: 20160901
Implantable Lead Location: 753859
Implantable Lead Location: 753860
Implantable Lead Model: 377
Implantable Lead Model: 377
Implantable Lead Serial Number: 49234000
Implantable Lead Serial Number: 49288022
Implantable Pulse Generator Implant Date: 20160901
Pulse Gen Model: 394929
Pulse Gen Serial Number: 68576339

## 2022-05-16 ENCOUNTER — Ambulatory Visit: Payer: 59 | Attending: Internal Medicine

## 2022-05-16 DIAGNOSIS — R7989 Other specified abnormal findings of blood chemistry: Secondary | ICD-10-CM

## 2022-05-16 DIAGNOSIS — Z79899 Other long term (current) drug therapy: Secondary | ICD-10-CM

## 2022-05-17 LAB — BASIC METABOLIC PANEL
BUN/Creatinine Ratio: 9 (ref 9–20)
BUN: 10 mg/dL (ref 6–24)
CO2: 22 mmol/L (ref 20–29)
Calcium: 9.1 mg/dL (ref 8.7–10.2)
Chloride: 101 mmol/L (ref 96–106)
Creatinine, Ser: 1.11 mg/dL (ref 0.76–1.27)
Glucose: 112 mg/dL — ABNORMAL HIGH (ref 70–99)
Potassium: 4.4 mmol/L (ref 3.5–5.2)
Sodium: 137 mmol/L (ref 134–144)
eGFR: 78 mL/min/{1.73_m2} (ref >59)

## 2022-06-09 NOTE — Progress Notes (Signed)
Remote pacemaker transmission.   

## 2022-08-10 ENCOUNTER — Ambulatory Visit (INDEPENDENT_AMBULATORY_CARE_PROVIDER_SITE_OTHER): Payer: 59

## 2022-08-10 DIAGNOSIS — I4891 Unspecified atrial fibrillation: Secondary | ICD-10-CM

## 2022-08-10 LAB — CUP PACEART REMOTE DEVICE CHECK
Battery Voltage: 45
Date Time Interrogation Session: 20240530075637
Implantable Lead Connection Status: 753985
Implantable Lead Connection Status: 753985
Implantable Lead Implant Date: 20160901
Implantable Lead Implant Date: 20160901
Implantable Lead Location: 753859
Implantable Lead Location: 753860
Implantable Lead Model: 377
Implantable Lead Model: 377
Implantable Lead Serial Number: 49234000
Implantable Lead Serial Number: 49288022
Implantable Pulse Generator Implant Date: 20160901
Pulse Gen Model: 394929
Pulse Gen Serial Number: 68576339

## 2022-08-29 NOTE — Progress Notes (Signed)
Remote pacemaker transmission.   

## 2022-10-16 ENCOUNTER — Encounter: Payer: Self-pay | Admitting: Internal Medicine

## 2022-10-17 MED ORDER — LOSARTAN POTASSIUM 50 MG PO TABS: 50.0000 mg | ORAL_TABLET | Freq: Every day | ORAL | 3 refills | Status: DC

## 2022-11-09 ENCOUNTER — Ambulatory Visit (INDEPENDENT_AMBULATORY_CARE_PROVIDER_SITE_OTHER): Payer: 59

## 2022-11-09 ENCOUNTER — Encounter: Payer: Self-pay | Admitting: Internal Medicine

## 2022-11-09 DIAGNOSIS — Q231 Congenital insufficiency of aortic valve: Secondary | ICD-10-CM | POA: Diagnosis not present

## 2022-11-09 LAB — CUP PACEART REMOTE DEVICE CHECK
Date Time Interrogation Session: 20240829131013
Implantable Lead Connection Status: 753985
Implantable Lead Connection Status: 753985
Implantable Lead Implant Date: 20160901
Implantable Lead Implant Date: 20160901
Implantable Lead Location: 753859
Implantable Lead Location: 753860
Implantable Lead Model: 377
Implantable Lead Model: 377
Implantable Lead Serial Number: 49234000
Implantable Lead Serial Number: 49288022
Implantable Pulse Generator Implant Date: 20160901
Pulse Gen Model: 394929
Pulse Gen Serial Number: 68576339

## 2022-11-14 ENCOUNTER — Ambulatory Visit: Payer: 59

## 2022-11-14 DIAGNOSIS — R55 Syncope and collapse: Secondary | ICD-10-CM

## 2022-11-14 LAB — CUP PACEART REMOTE DEVICE CHECK
Date Time Interrogation Session: 20240903162017
Implantable Lead Connection Status: 753985
Implantable Lead Connection Status: 753985
Implantable Lead Implant Date: 20160901
Implantable Lead Implant Date: 20160901
Implantable Lead Location: 753859
Implantable Lead Location: 753860
Implantable Lead Model: 377
Implantable Lead Model: 377
Implantable Lead Serial Number: 49234000
Implantable Lead Serial Number: 49288022
Implantable Pulse Generator Implant Date: 20160901
Pulse Gen Model: 394929
Pulse Gen Serial Number: 68576339

## 2022-11-16 NOTE — Progress Notes (Signed)
Remote pacemaker transmission.   

## 2022-11-22 NOTE — Progress Notes (Signed)
Remote pacemaker transmission.   

## 2022-12-18 ENCOUNTER — Other Ambulatory Visit: Payer: Self-pay

## 2022-12-18 MED ORDER — LOSARTAN POTASSIUM 50 MG PO TABS: 50.0000 mg | ORAL_TABLET | Freq: Every day | ORAL | 1 refills | Status: DC

## 2022-12-21 ENCOUNTER — Other Ambulatory Visit: Payer: Self-pay | Admitting: Medical Genetics

## 2022-12-21 DIAGNOSIS — Z006 Encounter for examination for normal comparison and control in clinical research program: Secondary | ICD-10-CM

## 2023-01-11 ENCOUNTER — Other Ambulatory Visit: Payer: Self-pay

## 2023-01-11 MED ORDER — LOSARTAN POTASSIUM 50 MG PO TABS: 50.0000 mg | ORAL_TABLET | Freq: Every day | ORAL | 0 refills | Status: DC

## 2023-02-13 ENCOUNTER — Ambulatory Visit (INDEPENDENT_AMBULATORY_CARE_PROVIDER_SITE_OTHER): Payer: 59

## 2023-02-13 DIAGNOSIS — I4891 Unspecified atrial fibrillation: Secondary | ICD-10-CM

## 2023-02-14 LAB — CUP PACEART REMOTE DEVICE CHECK
Battery Voltage: 40
Date Time Interrogation Session: 20241204090554
Implantable Lead Connection Status: 753985
Implantable Lead Connection Status: 753985
Implantable Lead Implant Date: 20160901
Implantable Lead Implant Date: 20160901
Implantable Lead Location: 753859
Implantable Lead Location: 753860
Implantable Lead Model: 377
Implantable Lead Model: 377
Implantable Lead Serial Number: 49234000
Implantable Lead Serial Number: 49288022
Implantable Pulse Generator Implant Date: 20160901
Pulse Gen Model: 394929
Pulse Gen Serial Number: 68576339

## 2023-02-15 ENCOUNTER — Encounter: Payer: Self-pay | Admitting: Internal Medicine

## 2023-02-15 ENCOUNTER — Ambulatory Visit: Payer: 59 | Attending: Internal Medicine | Admitting: Internal Medicine

## 2023-02-15 VITALS — BP 126/70 | HR 90 | Ht 73.0 in | Wt 257.0 lb

## 2023-02-15 DIAGNOSIS — Q2381 Bicuspid aortic valve: Secondary | ICD-10-CM | POA: Diagnosis not present

## 2023-02-15 NOTE — Patient Instructions (Signed)
Medication Instructions:  Your physician recommends that you continue on your current medications as directed. Please refer to the Current Medication list given to you today. *If you need a refill on your cardiac medications before your next appointment, please call your pharmacy*   Follow-Up: At [facility], you and your health needs are our priority.  As part of our continuing mission to provide you with exceptional heart care, we have created designated Provider Care Teams.  These Care Teams include your primary Cardiologist (physician) and Advanced Practice Providers (APPs -  Physician Assistants and Nurse Practitioners) who all work together to provide you with the care you need, when you need it.  We recommend signing up for the patient portal called "MyChart".  Sign up information is provided on this After Visit Summary.  MyChart is used to connect with patients for Virtual Visits (Telemedicine).  Patients are able to view lab/test results, encounter notes, upcoming appointments, etc.  Non-urgent messages can be sent to your provider as well.   To learn more about what you can do with MyChart, go to [email or web address].    Your next appointment:   1 year(s)  Provider:   Nobie Putnam, MD

## 2023-02-15 NOTE — Progress Notes (Signed)
Patient Care Team: Corrington, Meredith Mody, MD as PCP - General (Family Medicine) Pricilla Riffle, MD as PCP - Cardiology (Cardiology) Duke Salvia, MD as PCP - Electrophysiology (Cardiology) Glendale Chard, DO as Consulting Physician (Neurology)   HPI  Jason Osborn is a 57 y.o. male seen in follow up for syncope in the context of a previously implanted Biotronik pacemaker.  (9/16) undertaken because of tilt table test positivity.  Hx of Aortic valve repair and of his ascending aorta for bicuspid aortic valve.  He also has palpitations associated with PVCs  Hx of recurrent syncope with normal pacemaker function  Event recorder utilized.  Recurrent events.  Demonstrated sinus rhythm.  Heart rates were only about 100 suggesting that CLS was not sufficiently activated (oral)  Some sob and recurrent falls 1/month     The patient denies chest pain, nocturnal dyspnea, orthopnea or peripheral edema.  There have been no palpitations  Went to [employer]; working dx is MS. Marland Kitchen    DATE TEST EF    3/19 Stress MRI   Neg   10/19 LHC (CE)   No obstruc CAD  8/20 cMRI (CE)  76% LGE neg  12/21 CTA  Ca Score 0  7/24 cMRI 62% Bicuspid AoV          Date Cr K Hgb  2/22 1.12 4.2    7/24 1.11 4.5 14.2      Records and Results Reviewed   Past Medical History:  Diagnosis Date   Aneurysm, ascending aorta (HCC) 05/19/2014   Atrial fibrillation (HCC)    POST OPERATIVE   Atypical chest pain    NEGATIVE ADENOSINE CMR 05/23/17, NORMAL RIGHT AND LEFT HEART CATH AT [facility] ON 12/19/17   Bicuspid aortic valve    Cardiac pacemaker in situ    Chronic constipation    Dizziness    Elevated liver function tests    Essential hypertension    Fatty liver    Hypercholesteremia    Hyperlipidemia    Hypertension    Left elbow pain    Near syncope 05/18/2014   Neuropathy    Presence of permanent cardiac pacemaker    Sleep apnea    uses Cpap   Syncope 05/18/2014   CARDIOINHIBITOR SYNCOPE S/P PPM S/P  DISRUPTION OF ATRIAL LEAD FROM ABOVE SURGICAL PROCEDURE S/P LASER LEAD EXTRACTION/VISION 10/11 AT [facility]   Tendinopathy of elbow    Thoracic aortic aneurysm (HCC)    S/P ASCENDING HEMIARCH WITH AV REPAIR AT [facility] ON 12/20/17    Past Surgical History:  Procedure Laterality Date   BRONCHIAL NEEDLE ASPIRATION BIOPSY  04/27/2020   Procedure: BRONCHIAL NEEDLE ASPIRATION BIOPSIES;  Surgeon: Leslye Peer, MD;  Location: [facility] ENDOSCOPY;  Service: Pulmonary;;   BRONCHIAL WASHINGS  04/27/2020   Procedure: BRONCHIAL WASHINGS;  Surgeon: Leslye Peer, MD;  Location: [facility] ENDOSCOPY;  Service: Pulmonary;;   COLONOSCOPY     KNEE ARTHROSCOPY  2004, 1986   PACEMAKER INSERTION     THORACIC AORTIC ANEURYSM REPAIR  2019   VIDEO BRONCHOSCOPY WITH ENDOBRONCHIAL ULTRASOUND Bilateral 04/27/2020   Procedure: VIDEO BRONCHOSCOPY WITH ENDOBRONCHIAL ULTRASOUND;  Surgeon: Leslye Peer, MD;  Location: [facility] ENDOSCOPY;  Service: Pulmonary;  Laterality: Bilateral;    Current Meds  Medication Sig   modafinil (PROVIGIL) 100 MG tablet Take 1 tablet by mouth daily.    Allergies  Allergen Reactions   Amantadine Other (See Comments)    Nausea, low blood pressure Nausea, low blood  pressure    Atenolol Other (See Comments)    Able to tolerate in small doses (body aches)   Atorvastatin Rash and Other (See Comments)    Frequent urination Body Aches    Pyridostigmine Rash and Other (See Comments)    Body Aches *able to take in small doses Extremity weakness    Simvastatin Rash and Other (See Comments)    Body aches /Increased urination         Physical Exam BP 126/70   Pulse 90   Ht 6\' 1"  (1.854 m)   Wt 257 lb (116.6 kg)   SpO2 98%   BMI 33.91 kg/m   Well developed and well nourished in no acute distress HENT normal Neck supple with JVP-flat Clear Device pocket well healed; without hematoma or erythema.  There is no tethering  Regular rate and rhythm, no  murmur Abd-soft with active BS No  Clubbing cyanosis  edema Skin-warm and dry A & Oriented  Grossly normal sensory and motor function  ECG sinus at 86 Intervals 20/12/35  Device function is normal. Programming changes   See Paceart for details    Assessment and  Plan   Syncope   Dysautonomia   Small fiber neuropathy   Pacemaker-Biotronik   Ascending aortic aneurysm status post repair   Bicuspid aortic valve  VT-nonsustained  PVCs  Continues with falls,    Being evaluated for MS by [facility]

## 2023-02-15 NOTE — Progress Notes (Signed)
EKG EKG 

## 2023-02-24 LAB — CUP PACEART INCLINIC DEVICE CHECK
Date Time Interrogation Session: 20241205154145
Implantable Lead Connection Status: 753985
Implantable Lead Connection Status: 753985
Implantable Lead Implant Date: 20160901
Implantable Lead Implant Date: 20160901
Implantable Lead Location: 753859
Implantable Lead Location: 753860
Implantable Lead Model: 377
Implantable Lead Model: 377
Implantable Lead Serial Number: 49234000
Implantable Lead Serial Number: 49288022
Implantable Pulse Generator Implant Date: 20160901
Pulse Gen Model: 394929
Pulse Gen Serial Number: 68576339

## 2023-03-26 ENCOUNTER — Other Ambulatory Visit: Payer: Self-pay | Admitting: Internal Medicine

## 2023-03-27 ENCOUNTER — Other Ambulatory Visit: Payer: Self-pay

## 2023-03-27 MED ORDER — LOSARTAN POTASSIUM 50 MG PO TABS: 50.0000 mg | ORAL_TABLET | Freq: Every day | ORAL | 0 refills | Status: DC

## 2023-04-01 NOTE — Progress Notes (Unsigned)
Cardiology Office Note   Date:  04/02/2023   ID:  Jason Osborn, DOB [birth year], MRN [id number]  PCP:  Jason Presto, MD  Cardiologist:   Jason Pates, MD    Patient presens for F/U of dizziness, syncope   History of Present Illness: Jason Osborn is a 58 y.o. male with a history of bicuspid aortic valve, thoracic aortic aneurysm - s/p repair at [facility] in October 2019. Had post of AF. Was on amiodarone for a short time.    Other history includes PPM since 11/2014 for syncope and heart block noted on prior tilt table testing - had lead revision in 12/2017. Noted POTS as well and neuropathy. Has had care thru the [facility]) [facility] and at [facility]  The pt had a near syncpal spell in March 2020  Seen by cardiology in ED   Recomm hydration.   . The pt was seen by Jason Osborn in September 2021  Set up for event monitor  THis showed SR   PVCs that were sensed   NSVT (asymptomatic)  His CLS was reprogrammed to increase rate from 120 -140.      In Dec 2021 the pt was seen twice in [facility] ED with chest pain - and heaviness/ numbness in the L face- He has no history of CAD - LHC in 2019 showed no CAD. CT of the head was negative (done on return visit). CXR was negative. He did not stay and elected to leave. magnesium  In Dec 2021 had dizziness and L sided Chest pain/pressure. CT coronary angiogram done   Ca score 0   Did have lymphadenopathy noted in lungs    Referred to pulmonary     The pt continued to follow with Jason Osborn for medicine adjustments (off amlodipine, added Cozaar)   Also folllowed in Device clinic   I saw the tp in Feb 2023      Since seen he has also been seen at [facility] (neuro ) for small fiber neuropathy and management post thoracic transverse myelitis.    He was seen in Dec 2023   Doing fairly well at the time    The pt says he is doing fairly well   Does have dizziness but he is managing   Denies CP   Breathing is OK BP has been running higher        I saw the pt  in Jan 2024   He saw Jason Osborn in early December   ON Jan 5 he was with family when he lost vision in R eye   Went    Current Meds  Medication Sig   aspirin EC 81 MG tablet Take 81 mg by mouth daily.   losartan (COZAAR) 50 MG tablet Take 1 tablet (50 mg total) by mouth daily.   modafinil (PROVIGIL) 100 MG tablet Take 1 tablet by mouth daily.   nortriptyline (PAMELOR) 25 MG capsule Take 25 mg by mouth at bedtime.   nortriptyline (PAMELOR) 50 MG capsule Take 50 mg by mouth at bedtime.   OXcarbazepine (TRILEPTAL) 150 MG tablet Take 300 mg by mouth 2 (two) times daily.   pregabalin (LYRICA) 150 MG capsule Take 150 mg by mouth 3 (three) times daily.   rosuvastatin (CRESTOR) 20 MG tablet Take 20 mg by mouth every evening.   sildenafil (VIAGRA) 50 MG tablet Take 50 mg by mouth daily as needed for erectile dysfunction.   venlafaxine (EFFEXOR) 75 MG tablet Take 75 mg by  mouth in the morning and at bedtime. (in the morning & at night)   [DISCONTINUED] pregabalin (LYRICA) 100 MG capsule Take 100 mg by mouth in the morning, at noon, in the evening, and at bedtime.     Allergies:   Amantadine, Atenolol, Atorvastatin, Pyridostigmine, and Simvastatin   Past Medical History:  Diagnosis Date   Aneurysm, ascending aorta (HCC) 05/19/2014   Atrial fibrillation (HCC)    POST OPERATIVE   Atypical chest pain    NEGATIVE ADENOSINE CMR 05/23/17, NORMAL RIGHT AND LEFT HEART CATH AT [facility] ON 12/19/17   Bicuspid aortic valve    Cardiac pacemaker in situ    Chronic constipation    Dizziness    Elevated liver function tests    Essential hypertension    Fatty liver    Hypercholesteremia    Hyperlipidemia    Hypertension    Left elbow pain    Near syncope 05/18/2014   Neuropathy    Presence of permanent cardiac pacemaker    Sleep apnea    uses Cpap   Syncope 05/18/2014   CARDIOINHIBITOR SYNCOPE S/P PPM S/P DISRUPTION OF ATRIAL LEAD FROM ABOVE SURGICAL PROCEDURE S/P LASER LEAD EXTRACTION/VISION 10/11 AT [facility]    Tendinopathy of elbow    Thoracic aortic aneurysm (HCC)    S/P ASCENDING HEMIARCH WITH AV REPAIR AT [facility] ON 12/20/17    Past Surgical History:  Procedure Laterality Date   BRONCHIAL NEEDLE ASPIRATION BIOPSY  04/27/2020   Procedure: BRONCHIAL NEEDLE ASPIRATION BIOPSIES;  Surgeon: Jason Peer, MD;  Location: [facility] ENDOSCOPY;  Service: Pulmonary;;   BRONCHIAL WASHINGS  04/27/2020   Procedure: BRONCHIAL WASHINGS;  Surgeon: Jason Peer, MD;  Location: [facility] ENDOSCOPY;  Service: Pulmonary;;   COLONOSCOPY     KNEE ARTHROSCOPY  2004, 1986   PACEMAKER INSERTION     THORACIC AORTIC ANEURYSM REPAIR  2019   VIDEO BRONCHOSCOPY WITH ENDOBRONCHIAL ULTRASOUND Bilateral 04/27/2020   Procedure: VIDEO BRONCHOSCOPY WITH ENDOBRONCHIAL ULTRASOUND;  Surgeon: Jason Peer, MD;  Location: [facility] ENDOSCOPY;  Service: Pulmonary;  Laterality: Bilateral;     Social History:  The patient  reports that he has been smoking cigars. He has never used smokeless tobacco. He reports current alcohol use. He reports that he does not use drugs.   Family History:  The patient's family history includes Healthy in his mother.    ROS:  Please see the history of present illness. All other systems are reviewed and  Negative to the above problem except as noted.    PHYSICAL EXAM: VS:  BP 136/78   Pulse 84   Ht 6' (1.829 m)   Wt 252 lb 9.6 oz (114.6 kg)   SpO2 94%   BMI 34.26 kg/m     GEN:  Pt is in no acute distress  HEENT: normal  Neck: no JVD, not carotid bruit Cardiac: RRR; no murmur    Triv  edema  Respiratory:  clear to auscultation GI: soft, nontender, nondistended, + BS MS: no deformity Moving all extremities   Skin: warm and dry, no rash Neuro:  CN II to XII intact Psych: euthymic mood, full affect   EKG:  EKG is not ordered    Cardiac MRI ([facility]) ardiac MRI  1. The left ventricle is normal in cavity size and wall thickness. Global systolic function is hyperdynamic  with an LV ejection fraction  calculated at 62%. There are no regional wall motion abnormalities.   2. The right ventricle is normal in cavity size,  wall thickness, and systolic function. A RV lead is  inserted into the distal RV septum.   3. Both atria are normal in size. The RA lead inserts into the RA appendage.   4. The aortic valve is bicuspid in morphology. The patient is status post aortic valve repair (raphe  shaving/debridement of conjoint cusp). There is no significant aortic stenosis.  Peak aortic valve velocity  measures 1.8 m/s (estimated peak gradient 12 mm Hg; estimated mean gradient 6 mm Hg). There is a central  jet of mild-moderate aortic regurgitation.   5.  There is trivial mitral regurgitation, trivial tricuspid regurgitation, and trivial pulmonic  regurgitation.   6. Delayed enhancement MRI for viability is normal. There is no evidence of myocardial infarction,  scarring, or infiltration.   7. There is no evidence of an intracardiac thrombus.   8.  The pericardium is normal in thickness.  There is no significant pericardial effusion.   Thoracic MRA  1.  The patient is status post ascending aortic and hemi-arch repair. The aortic root measures 3.8 x 3.6  cm. The ascending aortic repair and arch repair are normal in size.  The descending thoracic aorta is  normal in size.  There is no evidence of an aortic dissection flap, coarctation, or patent ductus  arteriosus.   MRA bi-orthogonal luminal aortic dimensions:  Sinuses of Valsalva: 3.8 x 3.6 cm (3.9 x 3.7 cm)  Sinotubular junction: 3.0 x 2.9 cm (3.0 x 2.9 cm)  Asc. aorta (at level of PA bifurcation; below bend): 3.0 x 3.0 cm (3.1 x 3.0 cm)  Prox. arch (prior to the right innominate artery): 2.9 x 2.9 cm (2.9 x 2.9 cm)  Distal arch (after the left subclavian artery): 2.5 x 2.1 cm  (2.5 x 2.2 cm)  Descending thoracic aorta (at level of PA bifurcation): 2.7 x 2.6 cm (2.7 x 2.5 cm)  Descending aorta (diaphragm): 2.5 x 2.4 cm (2.4 x 2.3 cm)    Compared to the prior scan, there have been no significant changes in luminal diameters.   2.  The aortic arch is left sided. There is normal branching of the arch vessels.   3.  The main and proximal branch pulmonary arteries are normal in size.   4.  There are normal systemic and pulmonary venous connections.    CT coronary angiogram  2021  FINDINGS: Coronary calcium score: The patient's coronary artery calcium score is 0, which places the patient in the 0 percentile.   Coronary arteries: Normal coronary origins. Right dominance. [place] are on native sinotubular portion of aortic root (not reimplanted).   Right Coronary Artery: Normal caliber vessel, gives rise to PDA. No significant plaque or stenosis.   Left Main Coronary Artery: Normal caliber vessel. No significant plaque or stenosis. Large ramus intermedius without plaque or stenosis.   Left Anterior Descending Coronary Artery: Normal caliber vessel. No significant plaque or stenosis.   Left Circumflex Artery: Normal caliber vessel. No significant plaque or stenosis.   Aortic Valve and Ascending Aorta:   Bicuspid valve   S/P aortic valve repair (raphe shaving/debridement of conjoint cusp, Sievers 1) and ascending aorta/hemiarch repair 12/20/17 (28 mm ascending Dacron hemiarch graft and 28 mm Dacron graft from sinotubular junction to arch graft). I cannot see post-surgical images, but report reviewed from [facility] dated 10/12/2018 for comparison.   Other findings:   Normal pulmonary vein drainage into the left atrium.   Normal left atrial appendage without a thrombus.   Normal size of  the pulmonary artery.   Dual chamber pacemaker with leads terminating in the RA and RV.   IMPRESSION: 1. No evidence of CAD, CADRADS = 0. Coronary ostia are on native aortic root; Dacron grafts are distal to the coronary ostia.   2. Coronary calcium score of 0. This was 0 percentile for age and sex matched  control.   3. Normal coronary origin with right dominance.   4. History of bicuspid aortic valve and ascending aortic aneurysm S/P aortic valve repair (raphe shaving/debridement of conjoint cusp, Sievers 1) and ascending aorta/hemiarch repair 12/20/17 (28 mm ascending Dacron hemiarch graft and 28 mm Dacron graft from sinotubular junction to arch graft). I cannot see post-surgical images, but report reviewed from [facility] dated 10/12/2018 for comparison. There was an area of concern about 12 mm distal to the sewing ring of the aortic root graft. I reviewed the images with Dr. Llana Aliment, and he felt this was postsurgical and not dissection.   5. Lymphadenopathy as noted, recommend clinical correlation and consider repeat imaging.    Lipid Panel No results found for: "CHOL", "TRIG", "HDL", "CHOLHDL", "VLDL", "LDLCALC", "LDLDIRECT"    Wt Readings from Last 3 Encounters:  04/02/23 252 lb 9.6 oz (114.6 kg)  02/15/23 257 lb (116.6 kg)  05/01/22 243 lb 12.8 oz (110.6 kg)      ASSESSMENT AND PLAN:  1 Neuro   Autonomic dysfunction/ dizziness/syncope     Patient is doing fairly well at present   Will try to increase losartan for better BP control but may need to hold back if sympotms worsen Continue to follow at [facility]   2 Hx HTN    BP elevated   Would recomm increase in losartan to 100 mg  I would not expect that drastic change in BP    Follow   Get BMET in 2 wks  3Hx  CHB   s/p PPM (Biotronic)  Follows with EP/Device clinic   4  Asc aortic aneurysm   s/p repair at [facility] 2019  Followed   MRI in 2022  5  Hx bicuspid AV   MRI at [facility] in 2022     Mean gradient estimated at 6 mm Hg   Follow  6  Hx PVCs / NSVT   Pt denies palpitations    Plan for follow up in clinic in 1 year   MyChart with BP response   He has several other providers who will also be involved in care to see in interim.    Current medicines are reviewed at length with the patient today.  The patient does not  have concerns regarding medicines.  Signed, Jason Pates, MD  04/02/2023 10:58 AM    [facility] [address] [place], [place], [place]  [address] Phone: [phone]; Fax: [phone]

## 2023-04-02 ENCOUNTER — Encounter: Payer: Self-pay | Admitting: Internal Medicine

## 2023-04-02 ENCOUNTER — Ambulatory Visit: Payer: 59 | Attending: Internal Medicine | Admitting: Internal Medicine

## 2023-04-02 ENCOUNTER — Other Ambulatory Visit: Payer: Self-pay

## 2023-04-02 VITALS — BP 136/78 | HR 84 | Ht 72.0 in | Wt 252.6 lb

## 2023-04-02 DIAGNOSIS — G629 Polyneuropathy, unspecified: Secondary | ICD-10-CM | POA: Diagnosis not present

## 2023-04-02 DIAGNOSIS — G459 Transient cerebral ischemic attack, unspecified: Secondary | ICD-10-CM

## 2023-04-02 DIAGNOSIS — Q2381 Bicuspid aortic valve: Secondary | ICD-10-CM

## 2023-04-02 LAB — CUP PACEART INCLINIC DEVICE CHECK
Date Time Interrogation Session: 20250120114014
Implantable Lead Connection Status: 753985
Implantable Lead Connection Status: 753985
Implantable Lead Implant Date: 20160901
Implantable Lead Implant Date: 20160901
Implantable Lead Location: 753859
Implantable Lead Location: 753860
Implantable Lead Model: 377
Implantable Lead Model: 377
Implantable Lead Serial Number: 49234000
Implantable Lead Serial Number: 49288022
Implantable Pulse Generator Implant Date: 20160901
Pulse Gen Model: 394929
Pulse Gen Serial Number: 68576339

## 2023-04-02 NOTE — Patient Instructions (Signed)
Medication Instructions:   *If you need a refill on your cardiac medications before your next appointment, please call your pharmacy*   Lab Work:  If you have labs (blood work) drawn today and your tests are completely normal, you will receive your results only by: MyChart Message (if you have MyChart) OR A paper copy in the mail If you have any lab test that is abnormal or we need to change your treatment, we will call you to review the results.   Testing/Procedures:    Follow-Up: At [facility], you and your health needs are our priority.  As part of our continuing mission to provide you with exceptional heart care, we have created designated Provider Care Teams.  These Care Teams include your primary Cardiologist (physician) and Advanced Practice Providers (APPs -  Physician Assistants and Nurse Practitioners) who all work together to provide you with the care you need, when you need it.  We recommend signing up for the patient portal called "MyChart".  Sign up information is provided on this After Visit Summary.  MyChart is used to connect with patients for Virtual Visits (Telemedicine).  Patients are able to view lab/test results, encounter notes, upcoming appointments, etc.  Non-urgent messages can be sent to your provider as well.   To learn more about what you can do with MyChart, go to [email or web address].    Your next appointment:

## 2023-04-12 ENCOUNTER — Other Ambulatory Visit: Payer: Self-pay

## 2023-04-12 MED ORDER — LOSARTAN POTASSIUM 50 MG PO TABS: 50.0000 mg | ORAL_TABLET | Freq: Every day | ORAL | 3 refills | Status: DC

## 2023-05-15 ENCOUNTER — Ambulatory Visit: Payer: BC Managed Care – PPO

## 2023-05-15 DIAGNOSIS — R55 Syncope and collapse: Secondary | ICD-10-CM

## 2023-05-17 LAB — CUP PACEART REMOTE DEVICE CHECK
Battery Voltage: 40
Date Time Interrogation Session: 20250304142500
Implantable Lead Connection Status: 753985
Implantable Lead Connection Status: 753985
Implantable Lead Implant Date: 20160901
Implantable Lead Implant Date: 20160901
Implantable Lead Location: 753859
Implantable Lead Location: 753860
Implantable Lead Model: 377
Implantable Lead Model: 377
Implantable Lead Serial Number: 49234000
Implantable Lead Serial Number: 49288022
Implantable Pulse Generator Implant Date: 20160901
Pulse Gen Model: 394929
Pulse Gen Serial Number: 68576339

## 2023-06-19 NOTE — Addendum Note (Signed)
 Addended by: Geralyn Flash D on: 06/19/2023 09:48 AM   Modules accepted: Orders

## 2023-06-19 NOTE — Progress Notes (Signed)
 Remote pacemaker transmission.

## 2023-06-23 ENCOUNTER — Encounter: Payer: Self-pay | Admitting: Internal Medicine

## 2023-06-25 ENCOUNTER — Other Ambulatory Visit: Payer: Self-pay | Admitting: Internal Medicine

## 2023-07-05 ENCOUNTER — Encounter: Payer: Self-pay | Admitting: Internal Medicine

## 2023-08-14 ENCOUNTER — Ambulatory Visit (INDEPENDENT_AMBULATORY_CARE_PROVIDER_SITE_OTHER): Payer: BC Managed Care – PPO

## 2023-08-14 DIAGNOSIS — R55 Syncope and collapse: Secondary | ICD-10-CM | POA: Diagnosis not present

## 2023-08-14 LAB — CUP PACEART REMOTE DEVICE CHECK
Battery Voltage: 35
Date Time Interrogation Session: 20250603081809
Implantable Lead Connection Status: 753985
Implantable Lead Connection Status: 753985
Implantable Lead Implant Date: 20160901
Implantable Lead Implant Date: 20160901
Implantable Lead Location: 753859
Implantable Lead Location: 753860
Implantable Lead Model: 377
Implantable Lead Model: 377
Implantable Lead Serial Number: 49234000
Implantable Lead Serial Number: 49288022
Implantable Pulse Generator Implant Date: 20160901
Pulse Gen Model: 394929
Pulse Gen Serial Number: 68576339

## 2023-08-24 ENCOUNTER — Ambulatory Visit: Payer: Self-pay | Admitting: Cardiology

## 2023-10-09 NOTE — Progress Notes (Signed)
 Remote pacemaker transmission.

## 2023-11-13 ENCOUNTER — Ambulatory Visit: Payer: BC Managed Care – PPO

## 2024-01-09 ENCOUNTER — Other Ambulatory Visit: Payer: Self-pay | Admitting: Medical Genetics

## 2024-01-09 DIAGNOSIS — Z006 Encounter for examination for normal comparison and control in clinical research program: Secondary | ICD-10-CM

## 2024-02-05 NOTE — Telephone Encounter (Signed)
 Patients wife is calling to discuss the sleep study, she would like to see if he will be tested for narcolepsy, and RLS.   Please call to discuss.   Kerstin [phone]

## 2024-02-05 NOTE — Telephone Encounter (Signed)
 If he has RLS the sleep study will pick up on that but there is not a specific test for RLS. The sleep study will test him for sleep apnea. Narcolepsy is a daytime sleep study and the patient was not evaluated for narcolepsy during his visit because according to the referral they requested an updated sleep study for OSA

## 2024-02-05 NOTE — Telephone Encounter (Signed)
 Please inform patient that her insurance did not approve the in lab study and I have ordered a home sleep study

## 2024-02-06 NOTE — Telephone Encounter (Signed)
 Called and lvm

## 2024-02-08 NOTE — Telephone Encounter (Signed)
  Patient wife, Kerstin  Call back: [phone]  Wife calling back, the below information was provided to her. She is concerned because Dr. Abou Zeid was supposed to specify in the order her concern for narcolepsy. Can someone have her review this order and update wife.

## 2024-02-12 ENCOUNTER — Ambulatory Visit: Payer: BC Managed Care – PPO

## 2024-02-13 NOTE — Telephone Encounter (Signed)
 Contacted [facility] pharmacy and spoke with pharmacist Norleen.  Explained calling to verify the patient still has refills on file for his pregabalin (Lyrica).  Confirmed he does have refills & can request his next refill on or about 02/19/24.  Appreciate the assistance.

## 2024-03-21 ENCOUNTER — Other Ambulatory Visit: Payer: Self-pay | Admitting: Internal Medicine
